# Patient Record
Sex: Female | Born: 1975 | Race: Black or African American | Hispanic: No | Smoking: Never smoker
Health system: Southern US, Community
[De-identification: ages and names within clinical notes are randomized; demographics above are authoritative.]

## PROBLEM LIST (undated history)

## (undated) DIAGNOSIS — I2699 Other pulmonary embolism without acute cor pulmonale: Secondary | ICD-10-CM

## (undated) HISTORY — PX: OTHER SURGICAL HISTORY: SHX169

## (undated) HISTORY — PX: FINGER SURGERY: SHX640

---

## 2001-06-13 ENCOUNTER — Other Ambulatory Visit: Admission: RE | Admit: 2001-06-13 | Discharge: 2001-06-13 | Payer: Self-pay | Admitting: Obstetrics and Gynecology

## 2001-07-11 ENCOUNTER — Encounter: Admission: RE | Admit: 2001-07-11 | Discharge: 2001-10-09 | Payer: Self-pay | Admitting: Obstetrics and Gynecology

## 2001-07-29 ENCOUNTER — Inpatient Hospital Stay (HOSPITAL_COMMUNITY): Admission: AD | Admit: 2001-07-29 | Discharge: 2001-07-29 | Payer: Self-pay | Admitting: Obstetrics and Gynecology

## 2001-07-30 ENCOUNTER — Inpatient Hospital Stay (HOSPITAL_COMMUNITY): Admission: AD | Admit: 2001-07-30 | Discharge: 2001-07-30 | Payer: Self-pay | Admitting: *Deleted

## 2001-08-03 ENCOUNTER — Encounter (INDEPENDENT_AMBULATORY_CARE_PROVIDER_SITE_OTHER): Payer: Self-pay

## 2001-08-03 ENCOUNTER — Inpatient Hospital Stay (HOSPITAL_COMMUNITY): Admission: AD | Admit: 2001-08-03 | Discharge: 2001-08-15 | Payer: Self-pay | Admitting: *Deleted

## 2001-08-04 ENCOUNTER — Encounter: Payer: Self-pay | Admitting: Obstetrics and Gynecology

## 2001-08-07 ENCOUNTER — Encounter: Payer: Self-pay | Admitting: Obstetrics & Gynecology

## 2001-08-11 ENCOUNTER — Encounter: Payer: Self-pay | Admitting: Obstetrics and Gynecology

## 2001-08-12 ENCOUNTER — Encounter: Payer: Self-pay | Admitting: Obstetrics & Gynecology

## 2001-08-16 ENCOUNTER — Encounter: Admission: RE | Admit: 2001-08-16 | Discharge: 2001-09-15 | Payer: Self-pay | Admitting: Obstetrics and Gynecology

## 2001-09-15 ENCOUNTER — Other Ambulatory Visit: Admission: RE | Admit: 2001-09-15 | Discharge: 2001-09-15 | Payer: Self-pay | Admitting: Obstetrics and Gynecology

## 2001-10-14 ENCOUNTER — Encounter: Admission: RE | Admit: 2001-10-14 | Discharge: 2001-11-13 | Payer: Self-pay | Admitting: Obstetrics and Gynecology

## 2001-12-14 ENCOUNTER — Encounter: Admission: RE | Admit: 2001-12-14 | Discharge: 2002-01-13 | Payer: Self-pay | Admitting: Obstetrics and Gynecology

## 2002-02-13 ENCOUNTER — Encounter: Admission: RE | Admit: 2002-02-13 | Discharge: 2002-03-15 | Payer: Self-pay | Admitting: Obstetrics and Gynecology

## 2002-03-16 ENCOUNTER — Encounter: Admission: RE | Admit: 2002-03-16 | Discharge: 2002-04-15 | Payer: Self-pay | Admitting: Obstetrics and Gynecology

## 2002-05-16 ENCOUNTER — Encounter: Admission: RE | Admit: 2002-05-16 | Discharge: 2002-06-15 | Payer: Self-pay | Admitting: Obstetrics and Gynecology

## 2002-07-16 ENCOUNTER — Encounter: Admission: RE | Admit: 2002-07-16 | Discharge: 2002-08-15 | Payer: Self-pay | Admitting: Obstetrics and Gynecology

## 2002-08-16 ENCOUNTER — Encounter: Admission: RE | Admit: 2002-08-16 | Discharge: 2002-09-15 | Payer: Self-pay | Admitting: Obstetrics and Gynecology

## 2002-10-02 ENCOUNTER — Other Ambulatory Visit: Admission: RE | Admit: 2002-10-02 | Discharge: 2002-10-02 | Payer: Self-pay | Admitting: Obstetrics and Gynecology

## 2003-03-06 ENCOUNTER — Encounter: Payer: Self-pay | Admitting: Obstetrics and Gynecology

## 2003-03-06 ENCOUNTER — Encounter: Admission: RE | Admit: 2003-03-06 | Discharge: 2003-03-06 | Payer: Self-pay | Admitting: Obstetrics and Gynecology

## 2004-11-17 ENCOUNTER — Ambulatory Visit (HOSPITAL_COMMUNITY): Admission: RE | Admit: 2004-11-17 | Discharge: 2004-11-17 | Payer: Self-pay | Admitting: *Deleted

## 2004-12-12 ENCOUNTER — Ambulatory Visit (HOSPITAL_BASED_OUTPATIENT_CLINIC_OR_DEPARTMENT_OTHER): Admission: RE | Admit: 2004-12-12 | Discharge: 2004-12-12 | Payer: Self-pay | Admitting: *Deleted

## 2004-12-12 ENCOUNTER — Ambulatory Visit (HOSPITAL_COMMUNITY): Admission: RE | Admit: 2004-12-12 | Discharge: 2004-12-12 | Payer: Self-pay | Admitting: *Deleted

## 2004-12-12 ENCOUNTER — Encounter (INDEPENDENT_AMBULATORY_CARE_PROVIDER_SITE_OTHER): Payer: Self-pay | Admitting: *Deleted

## 2009-03-07 ENCOUNTER — Ambulatory Visit (HOSPITAL_COMMUNITY): Admission: RE | Admit: 2009-03-07 | Discharge: 2009-03-07 | Payer: Self-pay | Admitting: Obstetrics and Gynecology

## 2009-03-07 ENCOUNTER — Encounter (INDEPENDENT_AMBULATORY_CARE_PROVIDER_SITE_OTHER): Payer: Self-pay | Admitting: Obstetrics and Gynecology

## 2010-10-03 LAB — CBC
HCT: 36.2 % (ref 36.0–46.0)
Hemoglobin: 12.3 g/dL (ref 12.0–15.0)
MCHC: 33.9 g/dL (ref 30.0–36.0)
MCV: 94.1 fL (ref 78.0–100.0)
Platelets: 229 10*3/uL (ref 150–400)
RBC: 3.85 MIL/uL — ABNORMAL LOW (ref 3.87–5.11)
RDW: 13.4 % (ref 11.5–15.5)
WBC: 5 10*3/uL (ref 4.0–10.5)

## 2010-11-14 NOTE — Discharge Summary (Signed)
[facility]  Patient:    Helen Vincent, Helen Vincent Visit Number: [id number] MRN: [id number]          Service Type: OBS Location: 910A 9145 01 Attending Physician:  Ermalene Searing Dictated by:   Sheria Lang Cherly Hensen, M.D. Admit Date:  08/03/2001 Discharge Date: 08/15/2001                             Discharge Summary  ADMISSION DIAGNOSES:          1. Severe intrauterine growth restriction.                               2. Intrauterine gestation at 29-4/7 weeks.                               3. Preterm labor.  DISCHARGE DIAGNOSES:          1. Intrauterine growth restriction.                               2. Preterm gestation, delivered, status post                                  cesarean section.  PROCEDURES:                   A primary cesarean section.  HISTORY OF PRESENT ILLNESS:   This is a 35 year old, gravida 4, para 0-0-3-0 female at 29-4/[redacted] weeks gestation, admitted for close fetal surveillance secondary to severe intrauterine growth restriction.  The patient underwent ultrasound on August 03, 2001, which revealed an estimated fetal weight of 2 pounds 3 ounces, which was in the first percentile.  The amniotic fluid index  was normal.  The patients prenatal course has been been complicated by preterm cervical change for which the patient was placed on Procardia after contractions were noted.  She received betamethasone on July 29, 2001, and July 30, 2001.  HOSPITAL COURSE:              The patient was admitted.  She was placed on continuous fetal monitoring.  A nutritional consultation was obtained secondary to poor weight gain.  The patient is a nonsmoker.  She was continued on Procardia 10 mg p.o. q.4h.  Antiphospholipid syndrome laboratory studies were done.  PIH labs were done, which were normal.  The etiology of her intrauterine growth restriction was unclear.  The patient had biophysical profile and Dopplers.  Discussion was placed  regarding the possible use of amniocentesis to determine the role of genetics in this growth restriction. This was declined by the parents.  The patient had biophysical profile of 8/8. She subsequently on August 12, 2001, had absent end-diastolic flow, moderate variable decelerations, though occasional, were noted on the monitor.  Good variability otherwise.  Her case was reviewed with Conni Elliot, M.D., who was the perinatologist, and the decision was made to proceed with delivery.  The patient was taken to the operating room where she underwent a primary cesarean section.  Normal tubes and ovaries were identified.  Live female with a cord around the neck x 1.  Apgars of 6 and 7.  A small placenta was noted.  The  pathology showed 157 g placenta with hypermature villous features.  No intracavitary lesions were noted.  A cord pH was 7.27.  Her CBC on postoperative day #1 showed a hematocrit of 29, a hemoglobin of 10, and a white count of 13.  The patient had an unremarkable postoperative course with the exception of constipation issues.  Her incision showed no erythema, induration, or exudate.  She was deemed well to be discharged home.  DISPOSITION:                  Home.  CONDITION ON DISCHARGE:       Stable.  DISCHARGE MEDICATIONS:        1. Darvocet-N 100 #20 one p.o. q.6-8h. p.r.n.                                  pain.                               2. Motrin 600 mg one p.o. q.6-8h. p.r.n. pain.                               3. Prenatal vitamins one p.o. q.d.                               4. Ferrous sulfate 325 mg one p.o. q.d.                               5. Citrucel one p.o. q.d.                               6. Stool softener twice per day.  FOLLOW-UP INSTRUCTIONS:       Staple removal in the office.  Follow up in four to six weeks postpartum.  DISCHARGE INSTRUCTIONS:       Call for temperature greater than or equal to 100.4 degrees.  Nothing per vagina for four to six  weeks.  No heavy lifting or driving for two weeks.  Call for increased incisional pain, redness, or drainage from the incision site, severe abdominal pain, nausea, or vomiting. Dictated by:   Sheria Lang. Cherly Hensen, M.D. Attending Physician:  Marina Gravel B DD:  09/09/01 TD:  09/11/01 Job: [id number]

## 2010-11-14 NOTE — Op Note (Signed)
[facility]  Patient:    Helen Vincent, Helen Vincent Visit Number: [id number] MRN: [id number]          Service Type: OBS Location: 910A 9145 01 Attending Physician:  Ermalene Searing Dictated by:   Sheria Lang Cherly Hensen, M.D. Proc. Date: 08/12/01 Admit Date:  08/03/2001                             Operative Report  PREOPERATIVE DIAGNOSES:         1. Severe intrauterine growth restriction.                                 2. Abnormal fetal testing.  POSTOPERATIVE DIAGNOSES:        1. Severe intrauterine growth restriction.                                 2. Abnormal fetal testing.  OPERATION:                      Primary cesarean section.  SURGEON:                        Maxie Better, M.D.  ASSISTANT:                      Lenoard Aden, M.D.  ANESTHESIA:                     Spinal.  INDICATIONS:                    This is a 35 year old gravida 4, para 0-0-3-0 female admitted on August 03, 2001, for severe intrauterine growth restriction who on antenatal surveillance was found to have absent end-diastolic flow.  The patient has had betamethasone prior to admission to the hospital and her case was reviewed with the perinatologist.  Decision was made to proceed with a primary cesarean section.  INFORMED CONSENT:               The risks and benefits of the procedure have been explained to the patient and her significant other.  The patient was transferred to the operating room.  DESCRIPTION OF PROCEDURE:     Under adequate spinal anesthesia, the patient was placed in the supine position with a left lateral tilt.  She was st sterilely prepped and draped in the usual fashion.  An indwelling Foley catheter was sterilely placed.  A Pfannenstiel skin incision was then made and 0.25% Marcaine was injected along the incision line.  The incision was carried down to the rectus fascia.  The rectus fascia was incised in the midline and extended bilaterally  and the rectus muscle was then sharply and bluntly dissected off the rectus muscles in the superior and inferior fashion.  The rectus muscles were split in the midline and parietal peritoneum was entered bluntly and extended superiorly, inferiorly and transversely.  The vesicouterine peritoneum was then opened.  The bladder was then dissected off the lower uterine segment and displaced using a bladder retractor.  A curvilinear uterine incision was then made transversely and extended bilaterally using bandage scissors.  Artificial rupture of membranes of clear fluid was noted.  Subsequent delivery of a live female infant from the  right occiput transverse position was accomplished.  A cord around the neck was noted which was reduced.  The cord was then clamped and cut.  The baby was transferred to the awaiting pediatrician who subsequently found Apgars of 6 and 7 at one and five minutes.  The placenta was noted to be small and intact. It was manually removed and sent to pathology.  Cord pH has been obtained which was 7.27.  Uterine cavity was cleaned of debris.  No intracavitary lesions were noted.  The uterine incision was closed in two layers, the first layer of 0 Monocryl locked stitch and the second layer with 0 Monocryl. Normal tubes and ovaries were noted bilaterally.  The abdomen was then irrigated and suctioned, small bleeders cauterized.  The parietal peritoneum and the vesicouterine peritoneum were not closed.  The specimen was sent to pathology.  Estimated blood loss was 700 cc.  Intraoperative fluid was 1800 crystalloid.  Urine output was 200 cc of clear yellow urine.  Sponge and instrument counts x2 were correct.  Complications were none.  The patient tolerated the procedure well and was transferred to the recovery room in stable condition. Dictated by:   Sheria Lang. Cherly Hensen, M.D. Attending Physician:  Marina Gravel B DD:  08/12/01 TD:  08/13/01 Job: [id number]

## 2010-11-14 NOTE — H&P (Signed)
[facility]  Patient:    Helen Vincent, Helen Vincent Visit Number: [id number] MRN: [id number]          Service Type: Attending:  Sheronette A. Cherly Hensen, M.D. Dictated by:   Sheria Lang. Cherly Hensen, M.D. Adm. Date:  08/03/01                           History and Physical  DATE OF BIRTH:                03-05-1976  CHIEF COMPLAINT:              Severe intrauterine growth restriction.  HISTORY OF PRESENT ILLNESS:   This is a 35 year old gravida 4 para 0, 0-3-0, female who presented for late prenatal care, and whose EDC is October 15, 2001 by ultrasound, who is now at 29+ weeks gestation with severe intrauterine growth restriction, admitted for close antepartum fetal surveillance.  The patient had an ultrasound on August 03, 2001 which revealed an estimated fetal weight of 2 pounds 3 ounces, which was at the first percentile, normal amniotic fluid index.  The patients prenatal course to date has been complicated by preterm cervical change, initially noted on July 29, 2001. The patient was subsequently sent to [facility] for a nonstress test.  She received betamethasone on July 29, 2001 and July 30, 2001.  On todays visit she underwent a fetal fibronectin as well as a group B strep culture.  Cervical length was checked and was 3.2 cm. Clinically her examination is fingertip internal os, developing lower uterine segment, cervix about 2-2.5 cm long with vertex that is ballottable.  The patient was treated with bacterial vaginosis July 07, 2001.  She had nutritional consultation secondary to poor weight gain.  Her total weight gain to date is 18 pounds.  The patient is a nonsmoker.  She has no history of hypertension.  The patient has been on Procardia 10 mg p.o. q.4h after she presented for her second betamethasone shot and was noted to have contractions on the monitor.  Prenatal care is at [facility], primary  obstetrician Sheronette A. Cherly Hensen, M.D.  Blood type is A-positive.  Antibody screen negative.  Hemoglobin electrophoresis normal.  RPR nonreactive.  Rubella immune.   Hepatitis B surface antigen negative.  HIV test nonreactive.  GC and Chlamydia cultures negative.  The patient presented too late for AFP 3 testing.  Ultrasound on June 06, 2001 showed 21-1/7th weeks gestation with an [facility] of October 15, 2001.  The patient had anatomic survey done at [facility], which showed a normal anatomic fetal survey and 22 weeks by ultrasound done on June 16, 2001, with an [facility] of October 20, 2001.  Cervical length at that time was 3.9 cm.  One hour GCT was done, result is pending.  ALLERGIES:                    CODEINE.  MEDICATIONS:                  Prenatal vitamins.  PAST MEDICAL HISTORY:         1. Chronic constipation.                               2. Question irritable bowel syndrome.  3. Asthma in childhood.  PAST SURGICAL HISTORY:        1. Finger surgery.                               2. Hernia at age [age].                               3. D&C x3.  PAST OBSTETRICAL HISTORY:     1. First trimester TAB x2, in 1996 and 2000.                               2. Blighted ovum in 2001.  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  REVIEW OF SYSTEMS:            Negative.  PHYSICAL EXAMINATION:  GENERAL:                      Well-developed, well-nourished gravid black female, in no acute distress.  VITAL SIGNS:                  Blood pressure 118/76.  Weight 154.6 pounds. Fetal heart rate 148.  SKIN:                         No lesions.  HEENT:                        Sclerae anicteric.  Conjunctivae pink. Oropharynx negative.  HEART:                        Regular rate and rhythm without murmur.  LUNGS:                        Clear to auscultation.  BREAST:                        Soft, nontender.  No palpable mass.  ABDOMEN:                      Gravid, fundal height 28 cm.  PELVIC:                       Fingertip internal os, soft, 2.5 cm long, vertex high.  EXTREMITIES:                  No edema.  IMPRESSION:                   1. Severe intrauterine growth restriction,                                  intrauterine gestation at 29-4/7th weeks.                               2. Preterm cervical change/preterm labor.  PLAN:  1. Admission.                               2. Continue fetal monitoring.                               3. PIH laboratories.                               4. Antiphospholipid antibody syndrome                                  evaluation.                               5. Check fetal fibronectin and group B strep                                  culture.                               6. Continue Procardia 10 mg p.o. q.4h.                               7. Repeat growth at two weeks post last                                  sonogram.                               8. Nutritional consultation. Dictated by:   Sheria Lang. Cherly Hensen, M.D. Attending:  Sheronette A. Cherly Hensen, M.D. DD:  08/04/01 TD:  08/04/01 Job: [id number]

## 2010-11-14 NOTE — Op Note (Signed)
NAMETRINH, SANJOSE               ACCOUNT NO.:  [id number]   MEDICAL RECORD NO.:  [id number]          PATIENT TYPE:  AMB   LOCATION:  NESC                         FACILITY[facility]  [facility]   PHYSICIAN:  Vikki Ports, MDDATE OF BIRTH:  [birth year]   DATE OF PROCEDURE:  12/12/2004  DATE OF DISCHARGE:                                 OPERATIVE REPORT   PREOPERATIVE DIAGNOSIS:  Subcutaneous mass of the posterior neck.   POSTOPERATIVE DIAGNOSIS:  Subcutaneous mass of the posterior neck, lipoma.   PROCEDURE:  Excision of a posterior neck mass.   SURGEON:  Vikki Ports, M.D.   ANESTHESIA:  Local with MAC.   DESCRIPTION:  The patient was taken to the operating room, placed in the  left lateral decubitus position.  After adequate MAC anesthesia was induced,  the posterior neck was prepped and draped in a normal sterile fashion.  Using 1% lidocaine local anesthesia, the skin was anesthetized overlying the  mass.  A transverse incision was made.  The well-encapsulated lipoma was  delivered up and through the wound and excised in its entirety.  Adequate  hemostasis was insured, and the skin was closed with subcuticular 3-0  Monocryl.  A Dermabond dressing was placed.  The patient tolerated the  procedure well and went to the PACU in good condition.       KRH/MEDQ  D:  12/12/2004  T:  12/12/2004  Job:  [id number]

## 2010-12-22 ENCOUNTER — Inpatient Hospital Stay (INDEPENDENT_AMBULATORY_CARE_PROVIDER_SITE_OTHER)
Admission: RE | Admit: 2010-12-22 | Discharge: 2010-12-22 | Disposition: A | Discharge status: Home / Self Care | Payer: 59 | Source: Ambulatory Visit | Attending: Family Medicine | Admitting: Family Medicine

## 2010-12-22 DIAGNOSIS — R079 Chest pain, unspecified: Secondary | ICD-10-CM

## 2011-07-23 ENCOUNTER — Encounter (HOSPITAL_COMMUNITY): Payer: Self-pay | Admitting: Emergency Medicine

## 2011-07-23 ENCOUNTER — Emergency Department (HOSPITAL_COMMUNITY)
Admission: EM | Admit: 2011-07-23 | Discharge: 2011-07-23 | Disposition: A | Discharge status: Home / Self Care | Payer: 59 | Source: Home / Self Care | Attending: Emergency Medicine | Admitting: Emergency Medicine

## 2011-07-23 DIAGNOSIS — S139XXA Sprain of joints and ligaments of unspecified parts of neck, initial encounter: Secondary | ICD-10-CM

## 2011-07-23 DIAGNOSIS — S161XXA Strain of muscle, fascia and tendon at neck level, initial encounter: Secondary | ICD-10-CM

## 2011-07-23 HISTORY — DX: Other pulmonary embolism without acute cor pulmonale: I26.99

## 2011-07-23 LAB — PROTIME-INR: INR: 4.63 — ABNORMAL HIGH (ref 0.00–1.49)

## 2011-07-23 MED ORDER — MELOXICAM 15 MG PO TABS: 15.0000 mg | ORAL_TABLET | Freq: Every day | ORAL | Status: DC

## 2011-07-23 MED ORDER — DIAZEPAM 5 MG PO TABS: 5.0000 mg | ORAL_TABLET | Freq: Four times a day (QID) | ORAL | Status: AC | PRN

## 2011-07-23 NOTE — ED Provider Notes (Signed)
History     CSN: [id number]  Arrival date & time 07/23/11  1353   First MD Initiated Contact with Patient 07/23/11 1358      Chief Complaint  Patient presents with  . Generalized Body Aches    (Consider location/radiation/quality/duration/timing/severity/associated sxs/prior treatment) HPI Comments: Patient with 2 weeks of intermittent right upper neck and back achiness and "soreness". States the soreness radiates up the back of her head. No other headaches. Pain is worse with neck flexion, irritation, lifting heavy objects, and with use. Pain better with warm compresses and massage. Has also tried Tylenol and Motrin without improvement. Patient notes no change in physical activity, however, she does spend a lot of time with her neck bent forward while sitting. No nausea, vomiting, fevers, chest pain, wheezing, shortness of breath. No numbness, weakness, rash. No remote or recent history of trauma to the cervical or thoracic spine. Patient also with a history of spontaneous PE and was being managed by pulmonologist and cardiologist at [facility] for this. States that she discontinued the warfarin on her own several months ago due to hair loss, however, started herself on 10 mg of warfarin once daily proximally 2-3 months ago, because she was feeling short of breath. Patient has not had her PT/INR checked since restarting the warfarin. Patient denies chest pain or shortness of breath today. Denies epistaxis, hemoptysis, GI bleeding, easy bruising.  ROS as noted in HPI. All other ROS negative.   The history is provided by the patient.    Past Medical History  Diagnosis Date  . Pulmonary embolism     Past Surgical History  Procedure Date  . Finger surgery   . Lypoma   . Cesarean section     No family history on file.  History  Substance Use Topics  . Smoking status: Never Smoker   . Smokeless tobacco: Not on file  . Alcohol Use: No    OB History    Grav Para Term Preterm  Abortions TAB SAB Ect Mult Living                  Review of Systems  Allergies  Codeine and Shellfish allergy  Home Medications   Current Outpatient Rx  Name Route Sig Dispense Refill  . WARFARIN SODIUM PO Oral Take by mouth.    Marland Kitchen DIAZEPAM 5 MG PO TABS Oral Take 1 tablet (5 mg total) by mouth every 6 (six) hours as needed for anxiety. 16 tablet 0    BP 106/73  Pulse 74  Temp(Src) 99.2 F (37.3 C) (Oral)  Resp 18  SpO2 100%  LMP 07/01/2011  Physical Exam  Nursing note and vitals reviewed. Constitutional: She is oriented to person, place, and time. She appears well-developed and well-nourished. No distress.  HENT:  Head: Normocephalic and atraumatic.  Eyes: Conjunctivae and EOM are normal.  Neck: Normal range of motion and full passive range of motion without pain. Neck supple. Muscular tenderness present. No spinous process tenderness present.         Tenderness, muscle spasm R>L. Pain aggravated with lateral bending of head, rotation of head right.   Cardiovascular: Regular rhythm.   Pulmonary/Chest: Effort normal and breath sounds normal.  Abdominal: Soft. She exhibits no distension.  Musculoskeletal: Normal range of motion.       Back:       Shoulder exam  WNL bilaterally. Strength 5/5 bilaterally.  Sensation grossly intact. RP 2+ bilaterally.   Neurological: She is alert and oriented to person,  place, and time. She has normal strength. No cranial nerve deficit or sensory deficit. Gait normal.  Skin: Skin is warm and dry.  Psychiatric: She has a normal mood and affect. Her behavior is normal. Judgment and thought content normal.    ED Course  Procedures (including critical care time)   Labs Reviewed  PROTIME-INR   No results found.   1. Cervical strain     Results for orders placed during the hospital encounter of 07/23/11  PROTIME-INR      Component Value Range   Prothrombin Time 44.4 (*) 11.6 - 15.2 (seconds)   INR 4.63 (*) 0.00 - 1.49       MDM  H&P most consistent with muscle strain/spasm right trapezius and upper back. Has been spending more time with her head tilted forward, reading recently. Patient also states that she's not had her INR checked since restarting herself on Coumadin 10 mg once daily 3 months ago. She has a cardiologist at [facility]. Will check PT/ INR today, and have patient call back for results. Will change warfarin dose if needed at that time. Patient will take her results to her cardiologist with whom she has an appointment next Tuesday. Discussed when patient needs return to the ED. Patient agrees with plan.  Labs noted. Nurse called patient with results. Will have patient hold next dose of warfarin. Patient to have her INR checked within 24 hours of discontinuing warfarin, and followup with her cardiologist within one day after repeat INR and to have her warfarin restarted at an appropriate dose.  Luiz Blare, MD 07/23/11 2145

## 2011-07-23 NOTE — ED Notes (Signed)
C/o headache , shoulders, neck pain.  Onset 2 weeks ago.  Also c/o dizziness, weakness, fatigue, intermittent blurry vision

## 2011-07-23 NOTE — Discharge Instructions (Signed)
It will take 2 hours for the labs to come back. You can call before 6 pm today for your results, or after 1130 tomorrow.. You will need to come here in person to get a paper copy for you to take to your cardiologist. You may take the valium as needed for myuscle spasm. Continue tylenol 1 gram up to 4 times a day. Do not do this for more than 2 weeks as it can increase your INR and risk of bleeding. Continue warm compresses and massage. Return for fevers >100.4, serious nosebleeds, rectal bleeding, vomiting blood, or any concerns.

## 2011-07-23 NOTE — ED Notes (Signed)
Notified patient of labs, instructed to skip next warfarin dose and have blood drawn 24 hours after stopping warfarin dosing.  Patient agreeable.  Instructions were developed by dr Chaney Malling.  Patient was located at [phone].  [email or web address], rn

## 2011-07-23 NOTE — ED Notes (Signed)
Spoke to dr [employer] abouot labs

## 2011-07-23 NOTE — ED Notes (Signed)
Verified phone number [phone]

## 2011-07-24 NOTE — ED Notes (Addendum)
Pt. called back and she wanted Dr. Chaney Malling to know that she went to the Coumadin clinic today.  She said her INR was 4.9. She was told to stop the coumadin for the rest of the weekend and go back to them on Mon. for another PT-INR. They will continue to follow her.  Dr. Chaney Malling notified. Vassie Moselle 07/24/2011

## 2011-12-04 ENCOUNTER — Ambulatory Visit: Payer: 59 | Admitting: Physical Therapy

## 2011-12-10 ENCOUNTER — Encounter: Payer: 59 | Admitting: Physical Therapy

## 2011-12-10 ENCOUNTER — Ambulatory Visit: Payer: 59 | Attending: Neurology | Admitting: Physical Therapy

## 2011-12-10 DIAGNOSIS — R51 Headache: Secondary | ICD-10-CM | POA: Insufficient documentation

## 2011-12-10 DIAGNOSIS — IMO0001 Reserved for inherently not codable concepts without codable children: Secondary | ICD-10-CM | POA: Insufficient documentation

## 2011-12-10 DIAGNOSIS — R42 Dizziness and giddiness: Secondary | ICD-10-CM | POA: Insufficient documentation

## 2011-12-11 ENCOUNTER — Other Ambulatory Visit: Payer: Self-pay | Admitting: Obstetrics and Gynecology

## 2011-12-11 DIAGNOSIS — N644 Mastodynia: Secondary | ICD-10-CM

## 2011-12-16 ENCOUNTER — Ambulatory Visit: Payer: 59 | Admitting: Physical Therapy

## 2011-12-23 ENCOUNTER — Ambulatory Visit: Payer: 59 | Admitting: Physical Therapy

## 2011-12-24 ENCOUNTER — Ambulatory Visit
Admission: RE | Admit: 2011-12-24 | Discharge: 2011-12-24 | Disposition: A | Discharge status: Home / Self Care | Payer: 59 | Source: Ambulatory Visit | Attending: Obstetrics and Gynecology | Admitting: Obstetrics and Gynecology

## 2011-12-24 DIAGNOSIS — N644 Mastodynia: Secondary | ICD-10-CM

## 2011-12-29 ENCOUNTER — Encounter: Payer: 59 | Admitting: Physical Therapy

## 2012-01-06 ENCOUNTER — Encounter: Payer: 59 | Admitting: Physical Therapy

## 2012-01-07 ENCOUNTER — Ambulatory Visit: Payer: 59 | Attending: Neurology | Admitting: Physical Therapy

## 2012-01-07 DIAGNOSIS — IMO0001 Reserved for inherently not codable concepts without codable children: Secondary | ICD-10-CM | POA: Insufficient documentation

## 2012-01-07 DIAGNOSIS — R42 Dizziness and giddiness: Secondary | ICD-10-CM | POA: Insufficient documentation

## 2012-01-07 DIAGNOSIS — R51 Headache: Secondary | ICD-10-CM | POA: Insufficient documentation

## 2012-01-14 ENCOUNTER — Encounter: Payer: 59 | Admitting: Physical Therapy

## 2012-01-15 ENCOUNTER — Encounter: Payer: 59 | Admitting: Physical Therapy

## 2012-01-20 ENCOUNTER — Encounter: Payer: 59 | Admitting: Physical Therapy

## 2012-01-27 ENCOUNTER — Encounter: Payer: 59 | Admitting: Physical Therapy

## 2012-02-03 ENCOUNTER — Encounter: Payer: 59 | Admitting: Physical Therapy

## 2012-02-03 ENCOUNTER — Encounter: Payer: 59 | Admitting: *Deleted

## 2014-03-28 ENCOUNTER — Other Ambulatory Visit: Payer: Self-pay

## 2014-03-28 DIAGNOSIS — Z1231 Encounter for screening mammogram for malignant neoplasm of breast: Secondary | ICD-10-CM

## 2014-05-23 ENCOUNTER — Ambulatory Visit
Admission: RE | Admit: 2014-05-23 | Discharge: 2014-05-23 | Disposition: A | Discharge status: Home / Self Care | Payer: 59 | Source: Ambulatory Visit

## 2014-05-23 DIAGNOSIS — Z1231 Encounter for screening mammogram for malignant neoplasm of breast: Secondary | ICD-10-CM

## 2015-06-10 ENCOUNTER — Other Ambulatory Visit: Payer: Self-pay | Admitting: Family Medicine

## 2015-06-10 DIAGNOSIS — Z1231 Encounter for screening mammogram for malignant neoplasm of breast: Secondary | ICD-10-CM

## 2015-06-20 ENCOUNTER — Ambulatory Visit: Payer: Self-pay

## 2015-09-26 ENCOUNTER — Emergency Department (HOSPITAL_BASED_OUTPATIENT_CLINIC_OR_DEPARTMENT_OTHER): Payer: BLUE CROSS/BLUE SHIELD

## 2015-09-26 ENCOUNTER — Encounter (HOSPITAL_BASED_OUTPATIENT_CLINIC_OR_DEPARTMENT_OTHER): Payer: Self-pay | Admitting: Emergency Medicine

## 2015-09-26 ENCOUNTER — Emergency Department (HOSPITAL_BASED_OUTPATIENT_CLINIC_OR_DEPARTMENT_OTHER)
Admission: EM | Admit: 2015-09-26 | Discharge: 2015-09-27 | Disposition: A | Discharge status: Home / Self Care | Payer: BLUE CROSS/BLUE SHIELD | Attending: Emergency Medicine | Admitting: Emergency Medicine

## 2015-09-26 DIAGNOSIS — R202 Paresthesia of skin: Secondary | ICD-10-CM | POA: Diagnosis not present

## 2015-09-26 DIAGNOSIS — R51 Headache: Secondary | ICD-10-CM | POA: Insufficient documentation

## 2015-09-26 DIAGNOSIS — R200 Anesthesia of skin: Secondary | ICD-10-CM | POA: Diagnosis present

## 2015-09-26 DIAGNOSIS — Z7901 Long term (current) use of anticoagulants: Secondary | ICD-10-CM | POA: Insufficient documentation

## 2015-09-26 DIAGNOSIS — Z86711 Personal history of pulmonary embolism: Secondary | ICD-10-CM | POA: Diagnosis not present

## 2015-09-26 DIAGNOSIS — R0789 Other chest pain: Secondary | ICD-10-CM | POA: Diagnosis not present

## 2015-09-26 LAB — CBC
HCT: 30.6 % — ABNORMAL LOW (ref 36.0–46.0)
Hemoglobin: 9.7 g/dL — ABNORMAL LOW (ref 12.0–15.0)
MCH: 26.1 pg (ref 26.0–34.0)
MCHC: 31.7 g/dL (ref 30.0–36.0)
MCV: 82.3 fL (ref 78.0–100.0)
PLATELETS: 293 10*3/uL (ref 150–400)
RBC: 3.72 MIL/uL — AB (ref 3.87–5.11)
RDW: 16.1 % — AB (ref 11.5–15.5)
WBC: 6.1 10*3/uL (ref 4.0–10.5)

## 2015-09-26 LAB — COMPREHENSIVE METABOLIC PANEL
ALBUMIN: 4 g/dL (ref 3.5–5.0)
ALT: 10 U/L — AB (ref 14–54)
AST: 19 U/L (ref 15–41)
Alkaline Phosphatase: 53 U/L (ref 38–126)
Anion gap: 8 (ref 5–15)
BUN: 10 mg/dL (ref 6–20)
CHLORIDE: 106 mmol/L (ref 101–111)
CO2: 22 mmol/L (ref 22–32)
CREATININE: 0.98 mg/dL (ref 0.44–1.00)
Calcium: 9 mg/dL (ref 8.9–10.3)
GFR calc Af Amer: >60 mL/min (ref >60)
GFR calc non Af Amer: >60 mL/min (ref >60)
GLUCOSE: 113 mg/dL — AB (ref 65–99)
POTASSIUM: 3.8 mmol/L (ref 3.5–5.1)
Sodium: 136 mmol/L (ref 135–145)
Total Bilirubin: 0.8 mg/dL (ref 0.3–1.2)
Total Protein: 7.3 g/dL (ref 6.5–8.1)

## 2015-09-26 LAB — TROPONIN I: Troponin I: <0.03 ng/mL (ref <0.031)

## 2015-09-26 NOTE — ED Notes (Signed)
MD at bedside discussing test results and updated plan of care.

## 2015-09-26 NOTE — ED Notes (Signed)
Patient states that about an hour ago, she started to have pain to her left chest and numbness up to her left face and lip. Patient states that she continues to have pain to her left chest and left face. Patient reports that she is also have numbness to her left face, lips and arm

## 2015-09-26 NOTE — ED Provider Notes (Signed)
11:45 PM  Assumed care from Dr. Denton LankSteinl.  Pt is a 40 y.o. 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 Plan was to repeat second troponin as her workup in the emergency room has been unremarkable. Plan was to discharge patient home if troponin was negative and symptoms don't resolve. When I reevaluate patient, she reports she is still having left-sided facial numbness and left arm numbness. No obvious weakness on exam but she does report decreased sensation when I palpate her left face and arm. Her head CT is normal. The symptoms began at 7:30 PM. Her NIH stroke scale would be a 1 and therefore she is not a TPA candidate given her low stroke scale. She is very concerned and I feel is reasonable to obtain an MRI of her brain to rule out stroke. [facility] for an MRI of her brain. Have discussed with Dr. Sherlie BanScott Goldstein in the emergency department who has accepted the patient. She is not having any chest pain currently. Her labs today have been unremarkable other than mild anemia. Chest x-ray clear. Head CT shows no intracranial hemorrhage. [facility] We will leave her peripheral IV in her L AC.    Helen MawKristen N Beverlee Wilmarth, DO 09/26/15 2356

## 2015-09-26 NOTE — ED Notes (Signed)
Patient transported to X-ray 

## 2015-09-26 NOTE — Discharge Instructions (Signed)
It was our pleasure to provide your ER care today - we hope that you feel better.  Take your medication as prescribed - do not skip or miss doses.  Follow up with your primary care doctor in the next 1-2 days for recheck.  For chest pain, follow up with cardiologist in the coming week - see referral - call office to arrange appointment.  Return to ER right away if worse, new symptoms, change in speech or vision, one-sided loss of sensation or weakness, persistent or recurrent chest pain, trouble breathing, other concern.     Nonspecific Chest Pain  Chest pain can be caused by many different conditions. There is always a chance that your pain could be related to something serious, such as a heart attack or a blood clot in your lungs. Chest pain can also be caused by conditions that are not life-threatening. If you have chest pain, it is very important to follow up with your health care provider. CAUSES  Chest pain can be caused by:  Heartburn.  Pneumonia or bronchitis.  Anxiety or stress.  Inflammation around your heart (pericarditis) or lung (pleuritis or pleurisy).  A blood clot in your lung.  A collapsed lung (pneumothorax). It can develop suddenly on its own (spontaneous pneumothorax) or from trauma to the chest.  Shingles infection (varicella-zoster virus).  Heart attack.  Damage to the bones, muscles, and cartilage that make up your chest wall. This can include:  Bruised bones due to injury.  Strained muscles or cartilage due to frequent or repeated coughing or overwork.  Fracture to one or more ribs.  Sore cartilage due to inflammation (costochondritis). RISK FACTORS  Risk factors for chest pain may include:  Activities that increase your risk for trauma or injury to your chest.  Respiratory infections or conditions that cause frequent coughing.  Medical conditions or overeating that can cause heartburn.  Heart disease or family history of heart  disease.  Conditions or health behaviors that increase your risk of developing a blood clot.  Having had chicken pox (varicella zoster). SIGNS AND SYMPTOMS Chest pain can feel like:  Burning or tingling on the surface of your chest or deep in your chest.  Crushing, pressure, aching, or squeezing pain.  Dull or sharp pain that is worse when you move, cough, or take a deep breath.  Pain that is also felt in your back, neck, shoulder, or arm, or pain that spreads to any of these areas. Your chest pain may come and go, or it may stay constant. DIAGNOSIS Lab tests or other studies may be needed to find the cause of your pain. Your health care provider may have you take a test called an ambulatory ECG (electrocardiogram). An ECG records your heartbeat patterns at the time the test is performed. You may also have other tests, such as:  Transthoracic echocardiogram (TTE). During echocardiography, sound waves are used to create a picture of all of the heart structures and to look at how blood flows through your heart.  Transesophageal echocardiogram (TEE).This is a more advanced imaging test that obtains images from inside your body. It allows your health care provider to see your heart in finer detail.  Cardiac monitoring. This allows your health care provider to monitor your heart rate and rhythm in real time.  Holter monitor. This is a portable device that records your heartbeat and can help to diagnose abnormal heartbeats. It allows your health care provider to track your heart activity for several days, if  needed.  Stress tests. These can be done through exercise or by taking medicine that makes your heart beat more quickly.  Blood tests.  Imaging tests. TREATMENT  Your treatment depends on what is causing your chest pain. Treatment may include:  Medicines. These may include:  Acid blockers for heartburn.  Anti-inflammatory medicine.  Pain medicine for inflammatory  conditions.  Antibiotic medicine, if an infection is present.  Medicines to dissolve blood clots.  Medicines to treat coronary artery disease.  Supportive care for conditions that do not require medicines. This may include:  Resting.  Applying heat or cold packs to injured areas.  Limiting activities until pain decreases. HOME CARE INSTRUCTIONS  If you were prescribed an antibiotic medicine, finish it all even if you start to feel better.  Avoid any activities that bring on chest pain.  Do not use any tobacco products, including cigarettes, chewing tobacco, or electronic cigarettes. If you need help quitting, ask your health care provider.  Do not drink alcohol.  Take medicines only as directed by your health care provider.  Keep all follow-up visits as directed by your health care provider. This is important. This includes any further testing if your chest pain does not go away.  If heartburn is the cause for your chest pain, you may be told to keep your head raised (elevated) while sleeping. This reduces the chance that acid will go from your stomach into your esophagus.  Make lifestyle changes as directed by your health care provider. These may include:  Getting regular exercise. Ask your health care provider to suggest some activities that are safe for you.  Eating a heart-healthy diet. A registered dietitian can help you to learn healthy eating options.  Maintaining a healthy weight.  Managing diabetes, if necessary.  Reducing stress. SEEK MEDICAL CARE IF:  Your chest pain does not go away after treatment.  You have a rash with blisters on your chest.  You have a fever. SEEK IMMEDIATE MEDICAL CARE IF:   Your chest pain is worse.  You have an increasing cough, or you cough up blood.  You have severe abdominal pain.  You have severe weakness.  You faint.  You have chills.  You have sudden, unexplained chest discomfort.  You have sudden, unexplained  discomfort in your arms, back, neck, or jaw.  You have shortness of breath at any time.  You suddenly start to sweat, or your skin gets clammy.  You feel nauseous or you vomit.  You suddenly feel light-headed or dizzy.  Your heart begins to beat quickly, or it feels like it is skipping beats. These symptoms may represent a serious problem that is an emergency. Do not wait to see if the symptoms will go away. Get medical help right away. Call your local emergency services (911 in the [place] Do not drive yourself to the hospital.   This information is not intended to replace advice given to you by your health care provider. Make sure you discuss any questions you have with your health care provider.   Document Released: 03/25/2005 Document Revised: 07/06/2014 Document Reviewed: 01/19/2014 Elsevier Interactive Patient Education 2016 Elsevier Inc.    Paresthesia Paresthesia is an abnormal burning or prickling sensation. This sensation is generally felt in the hands, arms, legs, or feet. However, it may occur in any part of the body. Usually, it is not painful. The feeling may be described as:  Tingling or numbness.  Pins and needles.  Skin crawling.  Buzzing.  Limbs  falling asleep.  Itching. Most people experience temporary (transient) paresthesia at some time in their lives. Paresthesia may occur when you breathe too quickly (hyperventilation). It can also occur without any apparent cause. Commonly, paresthesia occurs when pressure is placed on a nerve. The sensation quickly goes away after the pressure is removed. For some people, however, paresthesia is a long-lasting (chronic) condition that is caused by an underlying disorder. If you continue to have paresthesia, you may need further medical evaluation. HOME CARE INSTRUCTIONS Watch your condition for any changes. Taking the following actions may help to lessen any discomfort that you are feeling:  Avoid drinking  alcohol.  Try acupuncture or massage to help relieve your symptoms.  Keep all follow-up visits as directed by your health care provider. This is important. SEEK MEDICAL CARE IF:  You continue to have episodes of paresthesia.  Your burning or prickling feeling gets worse when you walk.  You have pain, cramps, or dizziness.  You develop a rash. SEEK IMMEDIATE MEDICAL CARE IF:  You feel weak.  You have trouble walking or moving.  You have problems with speech, understanding, or vision.  You feel confused.  You cannot control your bladder or bowel movements.  You have numbness after an injury.  You faint.   This information is not intended to replace advice given to you by your health care provider. Make sure you discuss any questions you have with your health care provider.   Document Released: 06/05/2002 Document Revised: 10/30/2014 Document Reviewed: 06/11/2014 Elsevier Interactive Patient Education [employer] Elsevier Inc.

## 2015-09-26 NOTE — ED Provider Notes (Addendum)
CSN: [id number]     Arrival date & time 09/26/15  2014 History  By signing my name below, I, Gonzella LexKimberly Bianca Gray, attest that this documentation has been prepared under the direction and in the presence of Cathren LaineKevin Hailley Byers, MD. Electronically Signed: Gonzella LexKimberly Bianca Gray, Scribe. 09/26/2015. 8:47 PM.  Chief Complaint  Patient presents with  . Numbness   The history is provided by the patient. No language interpreter was used.   HPI Comments: Helen Vincent is a 40 y.o. female, with a PMHx of previous chest pain and bilateral pulmonary embolism in 2009 and 2012, ?associated w ocp use,  who presents to the Emergency Department complaining of sudden onset of constant, moderate, left-sided chest pain (preceded by a HA), with radiation into the left side of her face and down her LUE and LLE, which began about one hour ago while she was at a client's house. Patient indicates at time, client was emotional about possibly having child taken out of the home.  Pt notes associated light-headedness, as well as left-sided facial pain, left-sided facial numbness, and left-sided numbness in her lip, which have just begun to mildly improve while in the ED. She also reports associated trembling and notes tingling in her LLE. After leaving the client's house, pt stopped at [facility] and took 3 aspirin. She also reports that after her first bout of chest pain, she ate a piece of cake and has been feeling "burpy" and gassy since then. 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 Pt is currently on Xarelto but does not take it as regularly as she is supposed to, but states has adequate supply. She also reports that she was scheduled for an upcoming consultation for her recent SOB and coughing. Pt denies leg swelling, change in speech, hx of smoking, and issues with her balance or coordination  during her episode of chest pain. No recent immobility, trauma, travel, or surgery. Pt has a FHx of heart disease in her grandmother. No fam hx premature cad. No radiation of pain to abd, back or flank.   Past Medical History  Diagnosis Date  . Pulmonary embolism [facility](HCC)    Past Surgical History  Procedure Laterality Date  . Finger surgery    . Lypoma    . Cesarean section     History reviewed. No pertinent family history. Social History  Substance Use Topics  . Smoking status: Never Smoker   . Smokeless tobacco: None  . Alcohol Use: No   OB History    No data available     Review of Systems  Constitutional: Negative for fever and chills.  HENT: Negative for sore throat.        Facial pain and numbness  Respiratory: Positive for cough and shortness of breath.   Cardiovascular: Positive for chest pain. Negative for leg swelling.  Gastrointestinal: Negative for vomiting and diarrhea.       "feels gassy"  Genitourinary: Negative for dysuria.  Skin: Negative for rash.  Neurological: Positive for tremors, light-headedness, numbness and headaches. Negative for syncope and speech difficulty.  Psychiatric/Behavioral: Negative for confusion.   Allergies  Codeine and Shellfish allergy  Home Medications   Prior to Admission medications   Medication Sig Start Date End Date Taking? Authorizing Provider  rivaroxaban (XARELTO) 20 MG TABS tablet Take 20 mg by mouth daily with  supper.   Yes Historical Provider, MD  WARFARIN SODIUM PO Take by mouth.    Historical Provider, MD   There were no vitals taken for this visit. Physical Exam  Constitutional: She is oriented to person, place, and time. She appears well-developed and well-nourished. No distress.  HENT:  Head: Normocephalic and atraumatic.  Nose: Nose normal.  Mouth/Throat: Oropharynx is clear and moist.  No sinus or temporal tenderness.  Eyes: Conjunctivae and EOM are normal. Pupils are equal, round, and reactive to light. No  scleral icterus.  Neck: Neck supple. No tracheal deviation present. No thyromegaly present.  No stiffness or rigidity. No bruits.   Cardiovascular: Normal rate, regular rhythm, normal heart sounds and intact distal pulses.  Exam reveals no gallop and no friction rub.   No murmur heard. Equal, 2+ pulses bil.  Pulmonary/Chest: Effort normal and breath sounds normal. No respiratory distress. She has no wheezes.  Abdominal: Soft. Normal appearance and bowel sounds are normal. She exhibits no distension. There is no tenderness.  Genitourinary:  No cva tenderness.  Musculoskeletal: Normal range of motion. She exhibits no edema or tenderness.  Neurological: She is alert and oriented to person, place, and time. No cranial nerve deficit.  No pronator drift. Motor intact bilaterally. stre 5/5. sens intact. Steady gait.   Skin: Skin is warm and dry. No rash noted.  Psychiatric: She has a normal mood and affect.  Nursing note and vitals reviewed.   ED Course  Procedures  DIAGNOSTIC STUDIES:    Oxygen Saturation is 100% on RA, normal by my interpretation.   COORDINATION OF CARE:  8:49 PM Will order DG chest, CT head, EKG, and lab work. Discussed treatment plan with pt at bedside and pt agreed to plan.   Labs Review   Results for orders placed or performed during the hospital encounter of 09/26/15  CBC  Result Value Ref Range   WBC 6.1 4.0 - 10.5 K/uL   RBC 3.72 (L) 3.87 - 5.11 MIL/uL   Hemoglobin 9.7 (L) 12.0 - 15.0 g/dL   HCT 16.1 (L) 09.6 - 04.5 %   MCV 82.3 78.0 - 100.0 fL   MCH 26.1 26.0 - 34.0 pg   MCHC 31.7 30.0 - 36.0 g/dL   RDW 40.9 (H) 81.1 - 91.4 %   Platelets 293 150 - 400 K/uL  Comprehensive metabolic panel  Result Value Ref Range   Sodium 136 135 - 145 mmol/L   Potassium 3.8 3.5 - 5.1 mmol/L   Chloride 106 101 - 111 mmol/L   CO2 22 22 - 32 mmol/L   Glucose, Bld 113 (H) 65 - 99 mg/dL   BUN 10 6 - 20 mg/dL   Creatinine, Ser 7.82 0.44 - 1.00 mg/dL   Calcium 9.0 8.9 -  95.6 mg/dL   Total Protein 7.3 6.5 - 8.1 g/dL   Albumin 4.0 3.5 - 5.0 g/dL   AST 19 15 - 41 U/L   ALT 10 (L) 14 - 54 U/L   Alkaline Phosphatase 53 38 - 126 U/L   Total Bilirubin 0.8 0.3 - 1.2 mg/dL   GFR calc non Af Amer >60 >60 mL/min   GFR calc Af Amer >60 >60 mL/min   Anion gap 8 5 - 15  Troponin I  Result Value Ref Range   Troponin I <0.03 <0.031 ng/mL   Dg Chest 2 View  09/26/2015  CLINICAL DATA:  Chest pain EXAM: CHEST  2 VIEW COMPARISON:  06/19/2015 chest radiograph. FINDINGS: Stable cardiomediastinal silhouette  with normal heart size. No pneumothorax. No pleural effusion. Lungs appear clear, with no acute consolidative airspace disease and no pulmonary edema. IMPRESSION: No active cardiopulmonary disease. Electronically Signed   By: Delbert Phenix M.D.   On: 09/26/2015 21:06   Ct Head Wo Contrast  09/26/2015  CLINICAL DATA:  40 year old female with headache and left facial numbness EXAM: CT HEAD WITHOUT CONTRAST TECHNIQUE: Contiguous axial images were obtained from the base of the skull through the vertex without intravenous contrast. COMPARISON:  MRI dated 10/30/2010 FINDINGS: The ventricles and the sulci are appropriate in size for the patient's age. There is no intracranial hemorrhage. No midline shift or mass effect identified. The gray-white matter differentiation is preserved. The visualized paranasal sinuses and mastoid air cells are well aerated. The calvarium is intact. IMPRESSION: No acute intracranial pathology. Electronically Signed   By: Elgie Collard M.D.   On: 09/26/2015 21:18    I have personally reviewed and evaluated these images and lab results as part of my medical decision-making.   EKG Interpretation   Date/Time:  Thursday September 26 2015 20:27:17 EDT Ventricular Rate:  87 PR Interval:  120 QRS Duration: 95 QT Interval:  364 QTC Calculation: 438 R Axis:   70 Text Interpretation:  Sinus rhythm Nonspecific T abnormalities Confirmed  by Denton Lank  MD, Caryn Bee  ([id number]) on 09/26/2015 8:36:01 PM     MDM   I personally performed the services described in this documentation, which was scribed in my presence. The recorded information has been reviewed and considered. Cathren Laine, MD   Iv ns.   Continuous pulse ox and monitor.  No focal neuro deficits on exam.   Labs. Imaging.  Ct neg. cxr neg. Initial trop neg.  Given cp, will plan delta trop.  Recheck pt comfortably. No headache. No cp. No numbness/weakness.  2253, pt content/nad. No new c/o.  Repeat trop pending.  Signed out to Dr Elesa Massed to check repeat trop,  if repeat trop neg, and symptoms resolved, feel stable for d/c w close outpt f/u.        Cathren Laine, MD 09/26/15 [id number]

## 2015-09-26 NOTE — ED Notes (Signed)
MD at bedside. 

## 2015-09-27 ENCOUNTER — Emergency Department (HOSPITAL_COMMUNITY): Payer: BLUE CROSS/BLUE SHIELD

## 2015-09-27 NOTE — ED Notes (Signed)
Taken to MRI at this time

## 2015-09-27 NOTE — ED Provider Notes (Signed)
[facility]. Patient had presented initially with chest pain but also complaining of numbness on the face. Patient had chronic evaluation including EKG and 2 troponins that were negative. It was felt that this was adequate workup for cardiac etiology of her symptoms, but based on the neurologic symptoms, was transferred here to have MRI to rule out CVA. MRI has been performed and is negative for acute stroke. Etiology of symptoms is unclear. I do not feel that this is consistent with a TIA, does not require any further workup. Will follow-up with primary doctor.  Gilda Creasehristopher J Dashae Wilcher, MD 09/27/15 [id number]

## 2015-09-27 NOTE — ED Notes (Signed)
[facility] . Respirations unlabored .

## 2016-10-13 ENCOUNTER — Emergency Department (HOSPITAL_BASED_OUTPATIENT_CLINIC_OR_DEPARTMENT_OTHER): Payer: BLUE CROSS/BLUE SHIELD

## 2016-10-13 ENCOUNTER — Encounter (HOSPITAL_BASED_OUTPATIENT_CLINIC_OR_DEPARTMENT_OTHER): Payer: Self-pay

## 2016-10-13 ENCOUNTER — Emergency Department (HOSPITAL_BASED_OUTPATIENT_CLINIC_OR_DEPARTMENT_OTHER)
Admission: EM | Admit: 2016-10-13 | Discharge: 2016-10-13 | Disposition: A | Discharge status: Home / Self Care | Payer: BLUE CROSS/BLUE SHIELD | Attending: Emergency Medicine | Admitting: Emergency Medicine

## 2016-10-13 DIAGNOSIS — Z5321 Procedure and treatment not carried out due to patient leaving prior to being seen by health care provider: Secondary | ICD-10-CM | POA: Diagnosis not present

## 2016-10-13 DIAGNOSIS — Z7901 Long term (current) use of anticoagulants: Secondary | ICD-10-CM | POA: Diagnosis not present

## 2016-10-13 DIAGNOSIS — R079 Chest pain, unspecified: Secondary | ICD-10-CM

## 2016-10-13 LAB — BASIC METABOLIC PANEL
Anion gap: 7 (ref 5–15)
BUN: 13 mg/dL (ref 6–20)
CALCIUM: 8.9 mg/dL (ref 8.9–10.3)
CHLORIDE: 108 mmol/L (ref 101–111)
CO2: 24 mmol/L (ref 22–32)
Creatinine, Ser: 1.08 mg/dL — ABNORMAL HIGH (ref 0.44–1.00)
GFR calc Af Amer: >60 mL/min (ref >60)
Glucose, Bld: 111 mg/dL — ABNORMAL HIGH (ref 65–99)
Potassium: 3.7 mmol/L (ref 3.5–5.1)
SODIUM: 139 mmol/L (ref 135–145)

## 2016-10-13 LAB — CBC
HCT: 32.6 % — ABNORMAL LOW (ref 36.0–46.0)
Hemoglobin: 10.6 g/dL — ABNORMAL LOW (ref 12.0–15.0)
MCH: 28.1 pg (ref 26.0–34.0)
MCHC: 32.5 g/dL (ref 30.0–36.0)
MCV: 86.5 fL (ref 78.0–100.0)
PLATELETS: 257 10*3/uL (ref 150–400)
RBC: 3.77 MIL/uL — ABNORMAL LOW (ref 3.87–5.11)
RDW: 13.3 % (ref 11.5–15.5)
WBC: 5 10*3/uL (ref 4.0–10.5)

## 2016-10-13 LAB — TROPONIN I: Troponin I: <0.03 ng/mL (ref <0.03)
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
## 2016-10-13 NOTE — ED Triage Notes (Signed)
CP x 1 hour-CP x 3 events over the last 6 months-NAD-presents to triage in w/c

## 2019-06-24 ENCOUNTER — Emergency Department (HOSPITAL_BASED_OUTPATIENT_CLINIC_OR_DEPARTMENT_OTHER): Payer: BC Managed Care – PPO

## 2019-06-24 ENCOUNTER — Emergency Department (HOSPITAL_BASED_OUTPATIENT_CLINIC_OR_DEPARTMENT_OTHER)
Admission: EM | Admit: 2019-06-24 | Discharge: 2019-06-24 | Disposition: A | Discharge status: Home / Self Care | Payer: BC Managed Care – PPO | Attending: Emergency Medicine | Admitting: Emergency Medicine

## 2019-06-24 ENCOUNTER — Other Ambulatory Visit: Payer: Self-pay

## 2019-06-24 ENCOUNTER — Encounter (HOSPITAL_BASED_OUTPATIENT_CLINIC_OR_DEPARTMENT_OTHER): Payer: Self-pay

## 2019-06-24 DIAGNOSIS — R519 Headache, unspecified: Secondary | ICD-10-CM | POA: Diagnosis not present

## 2019-06-24 DIAGNOSIS — R0789 Other chest pain: Secondary | ICD-10-CM | POA: Diagnosis not present

## 2019-06-24 DIAGNOSIS — Z7901 Long term (current) use of anticoagulants: Secondary | ICD-10-CM | POA: Insufficient documentation

## 2019-06-24 DIAGNOSIS — Z20822 Contact with and (suspected) exposure to covid-19: Secondary | ICD-10-CM

## 2019-06-24 DIAGNOSIS — R05 Cough: Secondary | ICD-10-CM

## 2019-06-24 DIAGNOSIS — U071 COVID-19: Secondary | ICD-10-CM | POA: Diagnosis not present

## 2019-06-24 DIAGNOSIS — R059 Cough, unspecified: Secondary | ICD-10-CM

## 2019-06-24 DIAGNOSIS — G4700 Insomnia, unspecified: Secondary | ICD-10-CM | POA: Insufficient documentation

## 2019-06-24 DIAGNOSIS — R079 Chest pain, unspecified: Secondary | ICD-10-CM

## 2019-06-24 DIAGNOSIS — K5900 Constipation, unspecified: Secondary | ICD-10-CM

## 2019-06-24 LAB — CBC WITH DIFFERENTIAL/PLATELET
Abs Immature Granulocytes: 0.02 10*3/uL (ref 0.00–0.07)
Basophils Absolute: 0 10*3/uL (ref 0.0–0.1)
Basophils Relative: 1 %
Eosinophils Absolute: 0 10*3/uL (ref 0.0–0.5)
Eosinophils Relative: 1 %
HCT: 37.7 % (ref 36.0–46.0)
Hemoglobin: 11.6 g/dL — ABNORMAL LOW (ref 12.0–15.0)
Immature Granulocytes: 1 %
Lymphocytes Relative: 49 %
Lymphs Abs: 2 10*3/uL (ref 0.7–4.0)
MCH: 26.3 pg (ref 26.0–34.0)
MCHC: 30.8 g/dL (ref 30.0–36.0)
MCV: 85.5 fL (ref 80.0–100.0)
Monocytes Absolute: 0.3 10*3/uL (ref 0.1–1.0)
Monocytes Relative: 7 %
Neutro Abs: 1.5 10*3/uL — ABNORMAL LOW (ref 1.7–7.7)
Neutrophils Relative %: 41 %
Platelets: 226 10*3/uL (ref 150–400)
RBC: 4.41 MIL/uL (ref 3.87–5.11)
RDW: 19.2 % — ABNORMAL HIGH (ref 11.5–15.5)
WBC: 3.8 10*3/uL — ABNORMAL LOW (ref 4.0–10.5)
nRBC: 0 % (ref 0.0–0.2)

## 2019-06-24 LAB — COMPREHENSIVE METABOLIC PANEL
ALT: 15 U/L (ref 0–44)
AST: 24 U/L (ref 15–41)
Albumin: 3.9 g/dL (ref 3.5–5.0)
Alkaline Phosphatase: 63 U/L (ref 38–126)
Anion gap: 9 (ref 5–15)
BUN: 7 mg/dL (ref 6–20)
CO2: 23 mmol/L (ref 22–32)
Calcium: 8.6 mg/dL — ABNORMAL LOW (ref 8.9–10.3)
Chloride: 104 mmol/L (ref 98–111)
Creatinine, Ser: 1.08 mg/dL — ABNORMAL HIGH (ref 0.44–1.00)
GFR calc Af Amer: >60 mL/min (ref >60)
GFR calc non Af Amer: >60 mL/min (ref >60)
Glucose, Bld: 99 mg/dL (ref 70–99)
Potassium: 4.1 mmol/L (ref 3.5–5.1)
Sodium: 136 mmol/L (ref 135–145)
Total Bilirubin: 0.8 mg/dL (ref 0.3–1.2)
Total Protein: 7.5 g/dL (ref 6.5–8.1)

## 2019-06-24 LAB — TROPONIN I (HIGH SENSITIVITY)
Troponin I (High Sensitivity): <2 ng/L (ref <18)
Troponin I (High Sensitivity): <2 ng/L (ref <18)

## 2019-06-24 LAB — PREGNANCY, URINE: Preg Test, Ur: NEGATIVE

## 2019-06-24 MED ORDER — IOHEXOL 350 MG/ML SOLN
100.0000 mL | Freq: Once | INTRAVENOUS | Status: AC | PRN
  Administered 2019-06-24: 10:00:00 100 mL via INTRAVENOUS

## 2019-06-24 MED ORDER — ALPRAZOLAM 0.5 MG PO TABS: 0.5000 mg | ORAL_TABLET | Freq: Every evening | ORAL | 0 refills | Status: AC | PRN

## 2019-06-24 MED ORDER — SODIUM CHLORIDE 0.9 % IV BOLUS
1000.0000 mL | Freq: Once | INTRAVENOUS | Status: AC
  Administered 2019-06-24: 09:00:00 1000 mL via INTRAVENOUS

## 2019-06-24 MED ORDER — LACTULOSE ENCEPHALOPATHY 10 GM/15ML PO SOLN: 10.0000 g | Freq: Two times a day (BID) | ORAL | 0 refills | Status: AC | PRN

## 2019-06-24 NOTE — ED Triage Notes (Signed)
Pt states that she has been feeling sick for about 2 weeks. States that her son has had Covid, her Covid was negative but her Flu was positive. Pt also reports some SOB and productive cough. Ambulatory to room, VSS.

## 2019-06-24 NOTE — ED Provider Notes (Signed)
[facility] EMERGENCY DEPARTMENT Provider Note   CSN: [id number] Arrival date & time: 06/24/19  5643     History Chief Complaint  Patient presents with  . Fatigue  . Cough    Helen Vincent is a 43 y.o. female.  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  She has multiple complaints today.  She has had about 10 days of cough.  She is feels short of breath.  Her son is Covid positive but she tested negative although tested positive for flu.  She is complaining of not having a bowel movement for a week and having crampy abdominal pain.  No improvement with MiraLAX.  She is also complaining of poor sleep.  She self treated with prednisone for the last few days with some improvement in her cough.  She said today she woke up with some left-sided chest pain that felt like a bee sting in her.  No diaphoresis.  She is also complaining of feeling very anxious.  She said she skipped about a week of taking her Xarelto because she was very busy.  Also complaining of daily headaches.  No numbness or weakness.  No blurry vision double vision.  The history is provided by the patient.  Cough Cough characteristics:  Non-productive Severity:  Moderate Onset quality:  Gradual Duration:  10 days Timing:  Intermittent Progression:  Improving Chronicity:  New Context: sick contacts   Relieved by:  Nothing Worsened by:  Nothing Ineffective treatments:  None tried Associated symptoms: chest pain, headaches and shortness of breath   Associated symptoms: no ear fullness, no fever, no rash, no sore throat and no weight loss   Chest pain:    Quality: stabbing     Severity:  Moderate   Onset quality:  Sudden   Timing:  Constant   Progression:  Unchanged   Chronicity:  New      Past Medical History:  Diagnosis Date  . Pulmonary embolism (HCC)     There are no problems to display for this patient.   Past Surgical History:  Procedure Laterality Date  . CESAREAN SECTION    .  FINGER SURGERY    . lypoma       OB History   No obstetric history on file.     No family history on file.  Social History   Tobacco Use  . Smoking status: Never Smoker  . Smokeless tobacco: Never Used  Substance Use Topics  . Alcohol use: Yes    Comment: occ  . Drug use: No    Home Medications Prior to Admission medications   Medication Sig Start Date End Date Taking? Authorizing Provider  rivaroxaban (XARELTO) 20 MG TABS tablet Take 20 mg by mouth daily with supper.    [provider]    Allergies    Tylenol with codeine #3 [acetaminophen-codeine], Codeine, and Shellfish allergy  Review of Systems   Review of Systems  Constitutional: Negative for fever and weight loss.  HENT: Negative for sore throat.   Eyes: Negative for visual disturbance.  Respiratory: Positive for cough and shortness of breath.   Cardiovascular: Positive for chest pain.  Gastrointestinal: Positive for abdominal pain and constipation. Negative for nausea and vomiting.  Genitourinary: Negative for dysuria.  Musculoskeletal: Negative for back pain.  Skin: Negative for rash.  Neurological: Positive for headaches. Negative for syncope, speech difficulty, weakness and numbness.    Physical Exam Updated Vital Signs BP 106/75 (BP Location: Right Arm)  Pulse 64   Temp 98.8 F (37.1 C) (Oral)   Resp 20   Ht  (1.702 m)   Wt 92.5 kg   LMP 06/13/2019   SpO2 100%   BMI 31.95 kg/m   Physical Exam Vitals and nursing note reviewed.  Constitutional:      General: She is not in acute distress.    Appearance: She is well-developed.  HENT:     Head: Normocephalic and atraumatic.  Eyes:     Conjunctiva/sclera: Conjunctivae normal.  Cardiovascular:     Rate and Rhythm: Normal rate and regular rhythm.     Heart sounds: No murmur.  Pulmonary:     Effort: Pulmonary effort is normal. No respiratory distress.     Breath sounds: Normal breath sounds.  Abdominal:     Palpations:  Abdomen is soft.     Tenderness: There is no abdominal tenderness.  Musculoskeletal:        General: Normal range of motion.     Cervical back: Neck supple.     Right lower leg: No edema.     Left lower leg: No edema.  Skin:    General: Skin is warm and dry.     Capillary Refill: Capillary refill takes less than 2 seconds.  Neurological:     General: No focal deficit present.     Mental Status: She is alert.     Sensory: No sensory deficit.     Motor: No weakness.     Gait: Gait normal.     ED Results / Procedures / Treatments   Labs (all labs ordered are listed, but only abnormal results are displayed) Labs Reviewed  COMPREHENSIVE METABOLIC PANEL - Abnormal; Notable for the following components:      Result Value   Creatinine, Ser 1.08 (*)    Calcium 8.6 (*)    All other components within normal limits  CBC WITH DIFFERENTIAL/PLATELET - Abnormal; Notable for the following components:   WBC 3.8 (*)    Hemoglobin 11.6 (*)    RDW 19.2 (*)    Neutro Abs 1.5 (*)    All other components within normal limits  NOVEL CORONAVIRUS, NAA (HOSP ORDER, SEND-OUT TO REF LAB; TAT 18-24 HRS)  PREGNANCY, URINE  TROPONIN I (HIGH SENSITIVITY)  TROPONIN I (HIGH SENSITIVITY)    EKG EKG Interpretation  Date/Time:  Saturday June 24 2019 08:20:04 EST Ventricular Rate:  86 PR Interval:    QRS Duration: 89 QT Interval:  395 QTC Calculation: 473 R Axis:   60 Text Interpretation: Sinus rhythm Low voltage, precordial leads Borderline T abnormalities, diffuse leads similar to prior 4/18 Confirmed by Meridee Score [phone]) on 06/24/2019 8:22:46 AM   Radiology CT Head Wo Contrast  Result Date: 06/24/2019 CLINICAL DATA:  Headache for 10 days. EXAM: CT HEAD WITHOUT CONTRAST TECHNIQUE: Contiguous axial images were obtained from the base of the skull through the vertex without intravenous contrast. COMPARISON:  September 26, 2015 FINDINGS: Brain: No evidence of acute infarction, hemorrhage,  hydrocephalus, extra-axial collection or mass lesion/mass effect. Vascular: No hyperdense vessel is noted. Skull: Normal. Negative for fracture or focal lesion. Sinuses/Orbits: No acute finding. Other: None. IMPRESSION: No focal acute intracranial abnormality identified. Electronically Signed   By: Sherian Rein M.D.   On: 06/24/2019 10:29   CT Angio Chest PE W/Cm &/Or Wo Cm  Result Date: 06/24/2019 CLINICAL DATA:  43 year old female with influenza, fever, chills, headache, nausea, vomiting, a past history of pulmonary embolism and recent exposure to COVID.  EXAM: CT ANGIOGRAPHY CHEST CT ABDOMEN AND PELVIS WITH CONTRAST TECHNIQUE: Multidetector CT imaging of the chest was performed using the standard protocol during bolus administration of intravenous contrast. Multiplanar CT image reconstructions and MIPs were obtained to evaluate the vascular anatomy. Multidetector CT imaging of the abdomen and pelvis was performed using the standard protocol during bolus administration of intravenous contrast. CONTRAST:  OMNIPAQUE IOHEXOL 350 MG/ML SOLN COMPARISON:  Prior CTA chest 03/15/2017 FINDINGS: CTA CHEST FINDINGS Cardiovascular: 2 vessel aortic arch. The right brachiocephalic and left common carotid artery share a common origin. The pulmonary arteries are well opacified to the proximal segmental level. No evidence of central filling defect to suggest acute pulmonary embolus. The heart is normal in size. No pericardial effusion. Mediastinum/Nodes: Unremarkable CT appearance of the thyroid gland. No suspicious mediastinal or hilar adenopathy. No soft tissue mediastinal mass. The thoracic esophagus is unremarkable. Lungs/Pleura: Scattered bilateral predominantly peripheral interstitial and airspace opacities involving all lobes of both lungs with a lower lobe predominant pattern. No evidence of pulmonary edema, pleural effusion or pneumothorax. Musculoskeletal: No acute fracture or aggressive appearing lytic or  blastic osseous lesion. Review of the MIP images confirms the above findings. CT ABDOMEN and PELVIS FINDINGS Hepatobiliary: No focal liver abnormality is seen. No gallstones, gallbladder wall thickening, or biliary dilatation. Pancreas: Unremarkable. No pancreatic ductal dilatation or surrounding inflammatory changes. Spleen: Normal in size without focal abnormality. Adrenals/Urinary Tract: Adrenal glands are unremarkable. Kidneys are normal, without renal calculi, focal lesion, or hydronephrosis. Bladder is unremarkable. Stomach/Bowel: Stomach is within normal limits. Appendix appears normal. No evidence of bowel wall thickening, distention, or inflammatory changes. Vascular/Lymphatic: No significant vascular findings are present. No enlarged abdominal or pelvic lymph nodes. Reproductive: The uterus is diffusely enlarged. There are several areas of circumscribed hypoattenuation consistent with uterine leiomyomas. However, the endometrial cavity is also diffusely irregular with innumerable small subcentimeter low-attenuation foci speckled throughout the uterine myometrium adjacent to the endometrial canal. Additionally, there is a 2 cm solid enhancing mass within the endometrial canal. Other: No abdominal wall hernia or abnormality. No abdominopelvic ascites. Musculoskeletal: No acute fracture or aggressive appearing lytic or blastic osseous lesion. Review of the MIP images confirms the above findings. IMPRESSION: CTA CHEST 1. Bilateral peripheral and basilar ground-glass attenuation airspace opacities with associated bilateral lower lobe atelectasis. Findings are consistent with viral pneumonia including COVID-19, particularly given patient's recent exposure. 2. No evidence of acute pulmonary embolus. CT ABD/PELVIS 1. No acute abnormality within the abdomen or pelvis. 2. Abnormally enlarged uterus with multiple definitive uterine fibroids and additional findings of innumerable subcentimeter low-attenuation lesions  scattered throughout the myometrium around the periphery of the endometrial canal. Differential considerations include adenomyosis, diffuse uterine leiomyomatosis, and potentially endometrial carcinoma. Recommend non emergent referral to OBGYN for further evaluation. 3. Approximately 2 cm enhancing mass within the endometrial cavity may represent a pedunculated submucosal uterine fibroid, endometrial polyp, or endometrial neoplasm. A pedunculated uterine fibroid is favored. Electronically Signed   By: Malachy Moan M.D.   On: 06/24/2019 10:43   CT Abdomen Pelvis W Contrast  Result Date: 06/24/2019 CLINICAL DATA:  43 year old female with influenza, fever, chills, headache, nausea, vomiting, a past history of pulmonary embolism and recent exposure to COVID. EXAM: CT ANGIOGRAPHY CHEST CT ABDOMEN AND PELVIS WITH CONTRAST TECHNIQUE: Multidetector CT imaging of the chest was performed using the standard protocol during bolus administration of intravenous contrast. Multiplanar CT image reconstructions and MIPs were obtained to evaluate the vascular anatomy. Multidetector CT imaging of the abdomen  and pelvis was performed using the standard protocol during bolus administration of intravenous contrast. CONTRAST:  100mL OMNIPAQUE IOHEXOL 350 MG/ML SOLN COMPARISON:  Prior CTA chest 03/15/2017 FINDINGS: CTA CHEST FINDINGS Cardiovascular: 2 vessel aortic arch. The right brachiocephalic and left common carotid artery share a common origin. The pulmonary arteries are well opacified to the proximal segmental level. No evidence of central filling defect to suggest acute pulmonary embolus. The heart is normal in size. No pericardial effusion. Mediastinum/Nodes: Unremarkable CT appearance of the thyroid gland. No suspicious mediastinal or hilar adenopathy. No soft tissue mediastinal mass. The thoracic esophagus is unremarkable. Lungs/Pleura: Scattered bilateral predominantly peripheral interstitial and airspace opacities  involving all lobes of both lungs with a lower lobe predominant pattern. No evidence of pulmonary edema, pleural effusion or pneumothorax. Musculoskeletal: No acute fracture or aggressive appearing lytic or blastic osseous lesion. Review of the MIP images confirms the above findings. CT ABDOMEN and PELVIS FINDINGS Hepatobiliary: No focal liver abnormality is seen. No gallstones, gallbladder wall thickening, or biliary dilatation. Pancreas: Unremarkable. No pancreatic ductal dilatation or surrounding inflammatory changes. Spleen: Normal in size without focal abnormality. Adrenals/Urinary Tract: Adrenal glands are unremarkable. Kidneys are normal, without renal calculi, focal lesion, or hydronephrosis. Bladder is unremarkable. Stomach/Bowel: Stomach is within normal limits. Appendix appears normal. No evidence of bowel wall thickening, distention, or inflammatory changes. Vascular/Lymphatic: No significant vascular findings are present. No enlarged abdominal or pelvic lymph nodes. Reproductive: The uterus is diffusely enlarged. There are several areas of circumscribed hypoattenuation consistent with uterine leiomyomas. However, the endometrial cavity is also diffusely irregular with innumerable small subcentimeter low-attenuation foci speckled throughout the uterine myometrium adjacent to the endometrial canal. Additionally, there is a 2 cm solid enhancing mass within the endometrial canal. Other: No abdominal wall hernia or abnormality. No abdominopelvic ascites. Musculoskeletal: No acute fracture or aggressive appearing lytic or blastic osseous lesion. Review of the MIP images confirms the above findings. IMPRESSION: CTA CHEST 1. Bilateral peripheral and basilar ground-glass attenuation airspace opacities with associated bilateral lower lobe atelectasis. Findings are consistent with viral pneumonia including COVID-19, particularly given patient's recent exposure. 2. No evidence of acute pulmonary embolus. CT  ABD/PELVIS 1. No acute abnormality within the abdomen or pelvis. 2. Abnormally enlarged uterus with multiple definitive uterine fibroids and additional findings of innumerable subcentimeter low-attenuation lesions scattered throughout the myometrium around the periphery of the endometrial canal. Differential considerations include adenomyosis, diffuse uterine leiomyomatosis, and potentially endometrial carcinoma. Recommend non emergent referral to OBGYN for further evaluation. 3. Approximately 2 cm enhancing mass within the endometrial cavity may represent a pedunculated submucosal uterine fibroid, endometrial polyp, or endometrial neoplasm. A pedunculated uterine fibroid is favored. Electronically Signed   By: Malachy MoanHeath  McCullough M.D.   On: 06/24/2019 10:43    Procedures Procedures (including critical care time)  Medications Ordered in ED Medications  sodium chloride 0.9 % bolus 1,000 mL ( Intravenous Stopped 06/24/19 0940)  iohexol (OMNIPAQUE) 350 MG/ML injection 100 mL (100 mLs Intravenous Contrast Given 06/24/19 0948)    ED Course  I have reviewed the triage vital signs and the nursing notes.  Pertinent labs & imaging results that were available during my care of the patient were reviewed by me and considered in my medical decision making (see chart for details).  Clinical Course as of Jun 24 1715  Sat Jun 24, 2019  [id number] 43 year old female with multiple medical complaints including headache chest pain shortness of breath constipation insomnia and concern for having Covid.  She also skipped her  weeks worth of taking her anticoagulation.  I put her in for head CT because she is concerned she has had a leaking aneurysm in her head, chest CT for concern that she might of had a PE while not taking her medication, abdominal CT due to her crampy abdominal pain and concern that she is constipated.   [MB]  [id number] Blood pressure low here but looks like she runs around low 100 at baseline.  IV fluids  infusing.   [MB]  1052 Patient CTs do not show any critical findings.  Viral signs of pneumonia expected and probably Covid.  CT abdomen and pelvis showing some uterine fibroids patient is aware of and follows with gynecology for this.   [MB]  1128 Delta troponin negative.  Reviewed results with patient and she is comfortable with plan for discharge   [MB]    Clinical Course User Index [MB] Terrilee Files, MD   MDM Rules/Calculators/A&P                      Mahira DEON IVEY was evaluated in Emergency Department on 06/24/2019 for the symptoms described in the history of present illness. She was evaluated in the context of the global COVID-19 pandemic, which necessitated consideration that the patient might be at risk for infection with the SARS-CoV-2 virus that causes COVID-19. Institutional protocols and algorithms that pertain to the evaluation of patients at risk for COVID-19 are in a state of rapid change based on information released by regulatory bodies including the CDC and federal and state organizations. These policies and algorithms were followed during the patient's care in the ED.  Final Clinical Impression(s) / ED Diagnoses Final diagnoses:  Person under investigation for COVID-19  Acute nonintractable headache, unspecified headache type  Cough  Constipation, unspecified constipation type  Insomnia, unspecified type  Nonspecific chest pain    Rx / DC Orders ED Discharge Orders         Ordered    lactulose, encephalopathy, (CHRONULAC) 10 GM/15ML SOLN  2 times daily PRN     06/24/19 1056    ALPRAZolam (XANAX) 0.5 MG tablet  At bedtime PRN     06/24/19 1056           Terrilee Files, MD 06/24/19 1717

## 2019-06-24 NOTE — ED Notes (Signed)
ED Provider at bedside. 

## 2019-06-24 NOTE — Discharge Instructions (Addendum)
You were seen in the emergency department for evaluation of multiple complaints including headache chest pain shortness of breath constipation and insomnia.  Your work-up did not show any serious findings that would require you to be admitted.  Your CAT scan of your abdomen showed abnormalities in your uterus that will need to be followed up with your gynecologist.  The CT report is included below.  You were tested for Covid and will need to isolate until your test results are back.  If you are Covid positive you will need to isolate for at least 10 days.  Please follow-up with your doctor and return to the emergency department for any worsening symptoms.  1. No acute abnormality within the abdomen or pelvis.  2. Abnormally enlarged uterus with multiple definitive uterine  fibroids and additional findings of innumerable subcentimeter  low-attenuation lesions scattered throughout the myometrium around  the periphery of the endometrial canal. Differential considerations  include adenomyosis, diffuse uterine leiomyomatosis, and potentially  endometrial carcinoma. Recommend non emergent referral to OBGYN for  further evaluation.  3. Approximately 2 cm enhancing mass within the endometrial cavity  may represent a pedunculated submucosal uterine fibroid, endometrial  polyp, or endometrial neoplasm. A pedunculated uterine fibroid is  favored.

## 2019-06-26 LAB — NOVEL CORONAVIRUS, NAA (HOSP ORDER, SEND-OUT TO REF LAB; TAT 18-24 HRS): SARS-CoV-2, NAA: DETECTED — AB

## 2019-11-29 ENCOUNTER — Observation Stay (HOSPITAL_BASED_OUTPATIENT_CLINIC_OR_DEPARTMENT_OTHER)
Admission: EM | Admit: 2019-11-29 | Discharge: 2019-11-29 | Disposition: A | Discharge status: Home / Self Care | Payer: BC Managed Care – PPO | Attending: Internal Medicine | Admitting: Internal Medicine

## 2019-11-29 ENCOUNTER — Emergency Department (HOSPITAL_BASED_OUTPATIENT_CLINIC_OR_DEPARTMENT_OTHER): Payer: BC Managed Care – PPO

## 2019-11-29 ENCOUNTER — Encounter (HOSPITAL_BASED_OUTPATIENT_CLINIC_OR_DEPARTMENT_OTHER): Payer: Self-pay | Admitting: Emergency Medicine

## 2019-11-29 ENCOUNTER — Other Ambulatory Visit: Payer: Self-pay

## 2019-11-29 DIAGNOSIS — D649 Anemia, unspecified: Principal | ICD-10-CM | POA: Insufficient documentation

## 2019-11-29 DIAGNOSIS — Z7901 Long term (current) use of anticoagulants: Secondary | ICD-10-CM | POA: Diagnosis not present

## 2019-11-29 DIAGNOSIS — Z5329 Procedure and treatment not carried out because of patient's decision for other reasons: Secondary | ICD-10-CM | POA: Diagnosis not present

## 2019-11-29 DIAGNOSIS — R55 Syncope and collapse: Secondary | ICD-10-CM | POA: Insufficient documentation

## 2019-11-29 DIAGNOSIS — R519 Headache, unspecified: Secondary | ICD-10-CM

## 2019-11-29 LAB — BASIC METABOLIC PANEL
Anion gap: 7 (ref 5–15)
BUN: 12 mg/dL (ref 6–20)
CO2: 22 mmol/L (ref 22–32)
Calcium: 8.9 mg/dL (ref 8.9–10.3)
Chloride: 105 mmol/L (ref 98–111)
Creatinine, Ser: 0.87 mg/dL (ref 0.44–1.00)
GFR calc Af Amer: >60 mL/min (ref >60)
GFR calc non Af Amer: >60 mL/min (ref >60)
Glucose, Bld: 99 mg/dL (ref 70–99)
Potassium: 4 mmol/L (ref 3.5–5.1)
Sodium: 134 mmol/L — ABNORMAL LOW (ref 135–145)

## 2019-11-29 LAB — CBC WITH DIFFERENTIAL/PLATELET
Abs Immature Granulocytes: 0.03 10*3/uL (ref 0.00–0.07)
Basophils Absolute: 0 10*3/uL (ref 0.0–0.1)
Basophils Relative: 1 %
Eosinophils Absolute: 0.2 10*3/uL (ref 0.0–0.5)
Eosinophils Relative: 3 %
HCT: 20 % — ABNORMAL LOW (ref 36.0–46.0)
Hemoglobin: 6.1 g/dL — CL (ref 12.0–15.0)
Immature Granulocytes: 1 %
Lymphocytes Relative: 31 %
Lymphs Abs: 1.8 10*3/uL (ref 0.7–4.0)
MCH: 26.8 pg (ref 26.0–34.0)
MCHC: 30.5 g/dL (ref 30.0–36.0)
MCV: 87.7 fL (ref 80.0–100.0)
Monocytes Absolute: 0.6 10*3/uL (ref 0.1–1.0)
Monocytes Relative: 10 %
Neutro Abs: 3.3 10*3/uL (ref 1.7–7.7)
Neutrophils Relative %: 54 %
Platelets: 264 10*3/uL (ref 150–400)
RBC: 2.28 MIL/uL — ABNORMAL LOW (ref 3.87–5.11)
RDW: 14.8 % (ref 11.5–15.5)
WBC: 5.9 10*3/uL (ref 4.0–10.5)
nRBC: 1 % — ABNORMAL HIGH (ref 0.0–0.2)

## 2019-11-29 LAB — URINALYSIS, ROUTINE W REFLEX MICROSCOPIC
Bilirubin Urine: NEGATIVE
Glucose, UA: NEGATIVE mg/dL
Hgb urine dipstick: NEGATIVE
Ketones, ur: NEGATIVE mg/dL
Leukocytes,Ua: NEGATIVE
Nitrite: NEGATIVE
Protein, ur: NEGATIVE mg/dL
Specific Gravity, Urine: >1.03 — ABNORMAL HIGH (ref 1.005–1.030)
pH: 6 (ref 5.0–8.0)

## 2019-11-29 LAB — PREGNANCY, URINE: Preg Test, Ur: NEGATIVE

## 2019-11-29 LAB — CBG MONITORING, ED: Glucose-Capillary: 92 mg/dL (ref 70–99)

## 2019-11-29 MED ORDER — IOHEXOL 350 MG/ML SOLN
100.0000 mL | Freq: Once | INTRAVENOUS | Status: AC | PRN
  Administered 2019-11-29: 100 mL via INTRAVENOUS

## 2019-11-29 MED ORDER — DIPHENHYDRAMINE HCL 50 MG/ML IJ SOLN
25.0000 mg | Freq: Once | INTRAMUSCULAR | Status: AC
  Administered 2019-11-29: 25 mg via INTRAVENOUS
  Filled 2019-11-29: qty 1

## 2019-11-29 MED ORDER — PROCHLORPERAZINE EDISYLATE 10 MG/2ML IJ SOLN
10.0000 mg | Freq: Once | INTRAMUSCULAR | Status: AC
  Administered 2019-11-29: 10 mg via INTRAVENOUS
  Filled 2019-11-29: qty 2

## 2019-11-29 MED ORDER — SODIUM CHLORIDE 0.9 % IV BOLUS
1000.0000 mL | Freq: Once | INTRAVENOUS | Status: AC
  Administered 2019-11-29: 1000 mL via INTRAVENOUS

## 2019-11-29 NOTE — ED Provider Notes (Signed)
[facility] EMERGENCY DEPARTMENT Provider Note   CSN: [id number] Arrival date & time: 11/29/19  1504     History Chief Complaint  Patient presents with  . Headache  . Loss of Consciousness    Helen Vincent is a 44 y.o. female.  44 yo F with a chief complaints of a headache.  This been going on for about 3 weeks.  Occurs off and on.  Seems to be worse with head movement.  She was at work today and felt like her headache had gotten somewhat worse.  Usually is in the frontal or temporal regions.  She says it feels like her brain is floating inside of her head.  Feels like the pain is behind her forehead and also behind the right eye.  Never had a headache like this before.  She denies trauma denies fever.  Has had some left-sided neck pain with this off and on.  Today when the headache occurred she tried to ambulate to the door and she suddenly fell like she was in a pass out and then did. Got back to her chair and continued to work.  Went home and then felt like she wanted to sleep, but her family encouraged her to come   The history is provided by the patient.  Headache Pain location:  Frontal Severity currently:  8/10 Severity at highest:  8/10 Onset quality:  Gradual Duration:  3 weeks Timing:  Constant Progression:  Worsening Chronicity:  New Similar to prior headaches: no   Relieved by:  Nothing Worsened by:  Nothing Ineffective treatments:  None tried Associated symptoms: syncope   Associated symptoms: no congestion, no dizziness, no fever, no myalgias, no nausea and no vomiting   Loss of Consciousness Associated symptoms: headaches   Associated symptoms: no chest pain, no dizziness, no fever, no nausea, no palpitations, no shortness of breath and no vomiting        Past Medical History:  Diagnosis Date  . Pulmonary embolism [facility])     Patient Active Problem List   Diagnosis Date Noted  . Syncope 11/29/2019    Past Surgical History:  Procedure  Laterality Date  . CESAREAN SECTION    . FINGER SURGERY    . lypoma       OB History   No obstetric history on file.     No family history on file.  Social History   Tobacco Use  . Smoking status: Never Smoker  . Smokeless tobacco: Never Used  Substance Use Topics  . Alcohol use: Yes    Comment: occ  . Drug use: No    Home Medications Prior to Admission medications   Medication Sig Start Date End Date Taking? Authorizing Provider  ALPRAZolam Prudy Feeler) 0.5 MG tablet Take 1 tablet (0.5 mg total) by mouth at bedtime as needed for sleep. 06/24/19   Terrilee Files, MD  lactulose, encephalopathy, (CHRONULAC) 10 GM/15ML SOLN Take 15 mLs (10 g total) by mouth 2 (two) times daily as needed (constipation). 06/24/19   Terrilee Files, MD  rivaroxaban (XARELTO) 20 MG TABS tablet Take 20 mg by mouth daily with supper.    [provider]    Allergies    Tylenol with codeine #3 [acetaminophen-codeine], Codeine, and Shellfish allergy  Review of Systems   Review of Systems  Constitutional: Negative for chills and fever.  HENT: Negative for congestion and rhinorrhea.   Eyes: Negative for redness and visual disturbance.  Respiratory: Negative for shortness of breath  and wheezing.   Cardiovascular: Positive for syncope. Negative for chest pain and palpitations.  Gastrointestinal: Negative for nausea and vomiting.  Genitourinary: Negative for dysuria and urgency.  Musculoskeletal: Negative for arthralgias and myalgias.  Skin: Negative for pallor and wound.  Neurological: Positive for syncope and headaches. Negative for dizziness.    Physical Exam Updated Vital Signs BP 113/75 (BP Location: Right Arm)   Pulse 86   Temp 98 F (36.7 C) (Oral)   Resp 20   Ht 5\' 7"  (1.702 m)   Wt 93.9 kg   LMP 11/06/2019 (Approximate)   SpO2 100%   BMI 32.42 kg/m   Physical Exam Vitals and nursing note reviewed.  Constitutional:      General: She is not in acute distress.     Appearance: She is well-developed. She is not diaphoretic.     Comments: Marked pallor  HENT:     Head: Normocephalic and atraumatic.  Eyes:     Pupils: Pupils are equal, round, and reactive to light.  Cardiovascular:     Rate and Rhythm: Normal rate and regular rhythm.     Heart sounds: No murmur. No friction rub. No gallop.   Pulmonary:     Effort: Pulmonary effort is normal.     Breath sounds: No wheezing or rales.  Abdominal:     General: There is no distension.     Palpations: Abdomen is soft.     Tenderness: There is no abdominal tenderness.  Musculoskeletal:        General: No tenderness.     Cervical back: Normal range of motion and neck supple.  Skin:    General: Skin is warm and dry.  Neurological:     Mental Status: She is alert and oriented to person, place, and time.     Cranial Nerves: Cranial nerves are intact.     Sensory: Sensation is intact.     Motor: Motor function is intact.     Coordination: Coordination is intact.  Psychiatric:        Behavior: Behavior normal.     ED Results / Procedures / Treatments   Labs (all labs ordered are listed, but only abnormal results are displayed) Labs Reviewed  URINALYSIS, ROUTINE W REFLEX MICROSCOPIC - Abnormal; Notable for the following components:      Result Value   Specific Gravity, Urine >1.030 (*)    All other components within normal limits  CBC WITH DIFFERENTIAL/PLATELET - Abnormal; Notable for the following components:   RBC 2.28 (*)    Hemoglobin 6.1 (*)    HCT 20.0 (*)    nRBC 1.0 (*)    All other components within normal limits  BASIC METABOLIC PANEL - Abnormal; Notable for the following components:   Sodium 134 (*)    All other components within normal limits  SARS CORONAVIRUS 2 BY RT PCR (HOSPITAL ORDER, PERFORMED IN [place] [facility])  PREGNANCY, URINE  CBG MONITORING, ED  CBG MONITORING, ED    EKG None  Radiology CT Angio Head W or Wo Contrast  Result Date: 11/29/2019 CLINICAL  DATA:  Syncope. Dizziness and nausea. Subarachnoid hemorrhage suspected. EXAM: CT ANGIOGRAPHY HEAD AND NECK TECHNIQUE: Multidetector CT imaging of the head and neck was performed using the standard protocol during bolus administration of intravenous contrast. Multiplanar CT image reconstructions and MIPs were obtained to evaluate the vascular anatomy. Carotid stenosis measurements (when applicable) are obtained utilizing NASCET criteria, using the distal internal carotid diameter as the denominator. CONTRAST:  OMNIPAQUE IOHEXOL 350 MG/ML SOLN COMPARISON:  CT head 06/24/2019 FINDINGS: CT HEAD FINDINGS Brain: No evidence of acute infarction, hemorrhage, hydrocephalus, extra-axial collection or mass lesion/mass effect. Vascular: Negative for hyperdense vessel Skull: Negative Sinuses: Negative Orbits: Negative Review of the MIP images confirms the above findings CTA NECK FINDINGS Aortic arch: Bovine branching pattern. Proximal great vessels widely patent. Negative aortic arch. Right carotid system: Normal right carotid. Negative for stenosis or dissection Left carotid system: Normal left carotid. Negative for stenosis or dissection Vertebral arteries: Both vertebral arteries normal and widely patent. Skeleton: Cervical degenerative changes.  No acute skeletal lesion. Other neck: Negative for mass or adenopathy in the neck. Upper chest: Lung apices clear bilaterally. Review of the MIP images confirms the above findings CTA HEAD FINDINGS Anterior circulation: Normal cavernous carotid bilaterally. No significant atherosclerotic disease or stenosis. Anterior and middle cerebral arteries widely patent bilaterally. No aneurysm. Posterior circulation: Both vertebral arteries patent to the basilar. PICA patent bilaterally. Basilar widely patent. Superior cerebellar and posterior cerebral arteries patent bilaterally. Negative for aneurysm. Venous sinuses: Normal venous enhancement. Anatomic variants: None Review of the MIP  images confirms the above findings IMPRESSION: 1. Normal CT head 2. Normal CTA head neck. Electronically Signed   By: Marlan Palau M.D.   On: 11/29/2019 16:29   CT Angio Neck W and/or Wo Contrast  Result Date: 11/29/2019 CLINICAL DATA:  Syncope. Dizziness and nausea. Subarachnoid hemorrhage suspected. EXAM: CT ANGIOGRAPHY HEAD AND NECK TECHNIQUE: Multidetector CT imaging of the head and neck was performed using the standard protocol during bolus administration of intravenous contrast. Multiplanar CT image reconstructions and MIPs were obtained to evaluate the vascular anatomy. Carotid stenosis measurements (when applicable) are obtained utilizing NASCET criteria, using the distal internal carotid diameter as the denominator. CONTRAST:  OMNIPAQUE IOHEXOL 350 MG/ML SOLN COMPARISON:  CT head 06/24/2019 FINDINGS: CT HEAD FINDINGS Brain: No evidence of acute infarction, hemorrhage, hydrocephalus, extra-axial collection or mass lesion/mass effect. Vascular: Negative for hyperdense vessel Skull: Negative Sinuses: Negative Orbits: Negative Review of the MIP images confirms the above findings CTA NECK FINDINGS Aortic arch: Bovine branching pattern. Proximal great vessels widely patent. Negative aortic arch. Right carotid system: Normal right carotid. Negative for stenosis or dissection Left carotid system: Normal left carotid. Negative for stenosis or dissection Vertebral arteries: Both vertebral arteries normal and widely patent. Skeleton: Cervical degenerative changes.  No acute skeletal lesion. Other neck: Negative for mass or adenopathy in the neck. Upper chest: Lung apices clear bilaterally. Review of the MIP images confirms the above findings CTA HEAD FINDINGS Anterior circulation: Normal cavernous carotid bilaterally. No significant atherosclerotic disease or stenosis. Anterior and middle cerebral arteries widely patent bilaterally. No aneurysm. Posterior circulation: Both vertebral arteries patent to the  basilar. PICA patent bilaterally. Basilar widely patent. Superior cerebellar and posterior cerebral arteries patent bilaterally. Negative for aneurysm. Venous sinuses: Normal venous enhancement. Anatomic variants: None Review of the MIP images confirms the above findings IMPRESSION: 1. Normal CT head 2. Normal CTA head neck. Electronically Signed   By: Marlan Palau M.D.   On: 11/29/2019 16:29    Procedures Procedures (including critical care time)  Medications Ordered in ED Medications  sodium chloride 0.9 % bolus 1,000 mL ( Intravenous Stopped 11/29/19 1718)  prochlorperazine (COMPAZINE) injection 10 mg (10 mg Intravenous Given 11/29/19 1556)  diphenhydrAMINE (BENADRYL) injection 25 mg (25 mg Intravenous Given 11/29/19 1558)  iohexol (OMNIPAQUE) 350 MG/ML injection 100 mL (100 mLs Intravenous Contrast Given 11/29/19 1604)  ED Course  I have reviewed the triage vital signs and the nursing notes.  Pertinent labs & imaging results that were available during my care of the patient were reviewed by me and considered in my medical decision making (see chart for details).    MDM Rules/Calculators/A&P                      44 yo F with a chief complaints of a headache.  Going on for about 3 weeks.  Today she had the headache occur and then she had a syncopal event.  Sounds like it is worse with bearing down or with certain head movements.  Nontraumatic.  No neurologic deficits on my exam.  Will treat with a headache cocktail.  CT scan of the head.  Patient has had some improvements of her headache with Compazine and fluids.  She is found to be anemic with a hemoglobin of 6.1.  Last check back 5 months ago was 11.  She endorses some heavy vaginal bleeding.  She denies any rectal bleeding or dark stool.  I discussed with her about coming into the hospital.  Initially she agreed.  I discussed it with the hospitalist.  She will be back to the bedside because she really feels that she cannot stay.  I  cautioned her that if her anemia is to a point where it causes her to pass out then this could cause her death or permanent disability especially if she decides to operates a motor vehicle.  The patient would like to follow-up with her hematologist tomorrow.  She plans to call them first thing in the morning.  I offered to have her go to [facility] to get transfused in the ED.  She will be signed out AGAINST MEDICAL ADVICE.  CRITICAL CARE Performed by: Rae Roamaniel Patrick Skyrah Krupp   Total critical care time: 35 minutes  Critical care time was exclusive of separately billable procedures and treating other patients.  Critical care was necessary to treat or prevent imminent or life-threatening deterioration.  Critical care was time spent personally by me on the following activities: development of treatment plan with patient and/or surrogate as well as nursing, discussions with consultants, evaluation of patient's response to treatment, examination of patient, obtaining history from patient or surrogate, ordering and performing treatments and interventions, ordering and review of laboratory studies, ordering and review of radiographic studies, pulse oximetry and re-evaluation of patient's condition.   5:38 PM:  I have discussed the diagnosis/risks/treatment options with the patient and believe the pt to be eligible for discharge home to follow-up with Heme/onc. We also discussed returning to the ED immediately if new or worsening sx occur. We discussed the sx which are most concerning (e.g., sudden worsening pain, fever, repeat syncope) that necessitate immediate return. Medications administered to the patient during their visit and any new prescriptions provided to the patient are listed below.  Medications given during this visit Medications  sodium chloride 0.9 % bolus 1,000 mL ( Intravenous Stopped 11/29/19 1718)  prochlorperazine (COMPAZINE) injection 10 mg (10 mg Intravenous Given 11/29/19 1556)   diphenhydrAMINE (BENADRYL) injection 25 mg (25 mg Intravenous Given 11/29/19 1558)  iohexol (OMNIPAQUE) 350 MG/ML injection 100 mL (100 mLs Intravenous Contrast Given 11/29/19 1604)     The patient appears reasonably screen and/or stabilized for discharge and I doubt any other medical condition or other [facility] requiring further screening, evaluation, or treatment in the ED at this time prior to discharge.  Final Clinical Impression(s) / ED Diagnoses Final diagnoses:  Symptomatic anemia  Frontal headache    Rx / DC Orders ED Discharge Orders    None       Deno Etienne, DO 11/29/19 1738

## 2019-11-29 NOTE — ED Notes (Signed)
States she passed out at work, been having a headache for the past 3 weeks, feels weak, tired, having dizziness and nausea.

## 2019-11-29 NOTE — Discharge Instructions (Signed)
I am concerned because you passed out today.  This is most likely because your blood levels are low.  That usually means that you likely need a transfusion.  Please follow-up with your hematologist and see if they can see you tomorrow in the office.  At anytime you would like to return to the ED for repeat evaluation please return and we will be happy to see you.  You may want to go to a emergency department that is attached to the hospital then they could potentially transfuse you blood while you are in the ED.  Please return for any worsening symptoms or if you pass out again.

## 2019-11-29 NOTE — ED Notes (Signed)
ED Provider at bedside. 

## 2019-11-29 NOTE — ED Triage Notes (Signed)
States Headache for 2 weeks, then states had a syncopal episode 2 hours ago at work while trying to sit In rolling chair, states did not hit head. Drove here, alert, amb.

## 2019-11-29 NOTE — ED Notes (Signed)
[facility] ED NURSING NOTE: son at bedside to take pt home, pt has agreed not to drive, explained to pt again the rationale for admission, still refuses to be admitted, EDP very aware and has spoken with pt. AVS reviewed with client and strongly encouraged to make appt with her MD.

## 2020-06-10 IMAGING — CT CT ABD-PELV W/ CM
3 of 17 series · 11 of 46 positions shown, 15 images · IV contrast (Omnipaque)
Comparison: Prior CTA chest 03/15/2017

CLINICAL DATA: 43-year-old female with influenza, fever, chills,
headache, nausea, vomiting, a past history of pulmonary embolism and
recent exposure to COVID.

EXAM:
CT ANGIOGRAPHY CHEST
CT ABDOMEN AND PELVIS WITH CONTRAST
TECHNIQUE: Multidetector CT imaging of the chest was performed using the
standard protocol during bolus administration of intravenous
contrast. Multiplanar CT image reconstructions and MIPs were
obtained to evaluate the vascular anatomy. Multidetector CT imaging
of the abdomen and pelvis was performed using the standard protocol
during bolus administration of intravenous contrast.
CONTRAST:  100mL OMNIPAQUE IOHEXOL 350 MG/ML SOLN

[Series 8: pe thins · axial · 0.98mm/px · z∈[-284,-61]mm · 8 of 269 slices shown]
[im 23/269  soft-tissue]
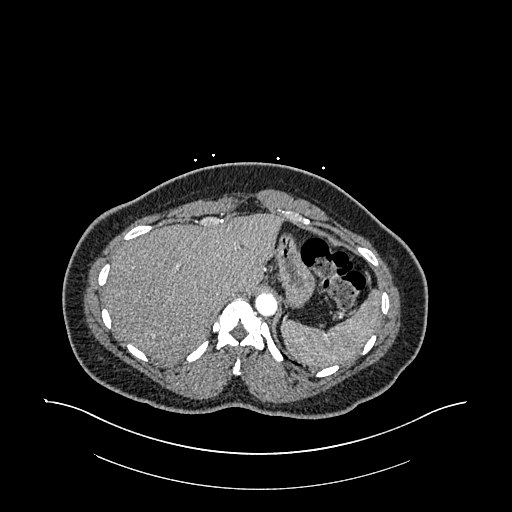
[im 68/269  soft-tissue]
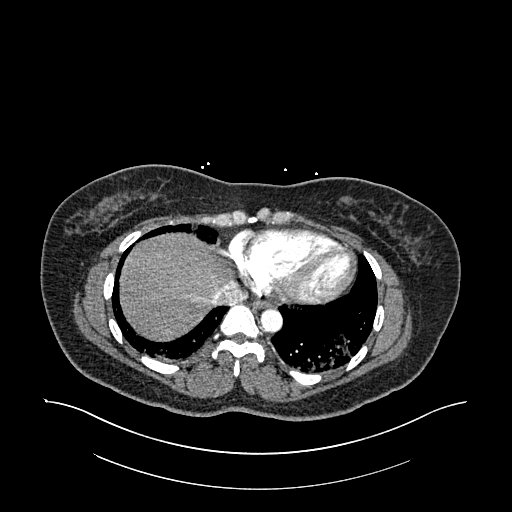
[im 90/269  soft-tissue]
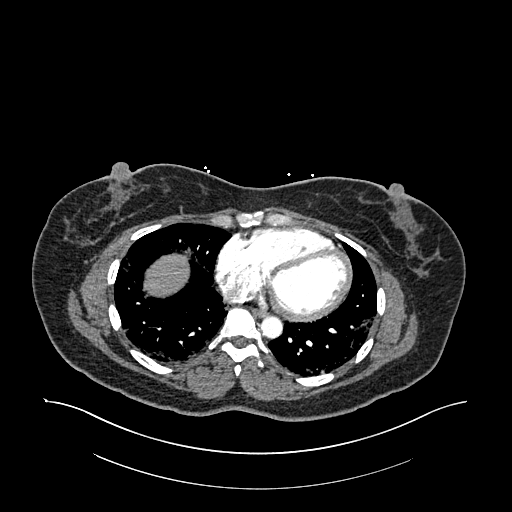
[im 112/269  soft-tissue]
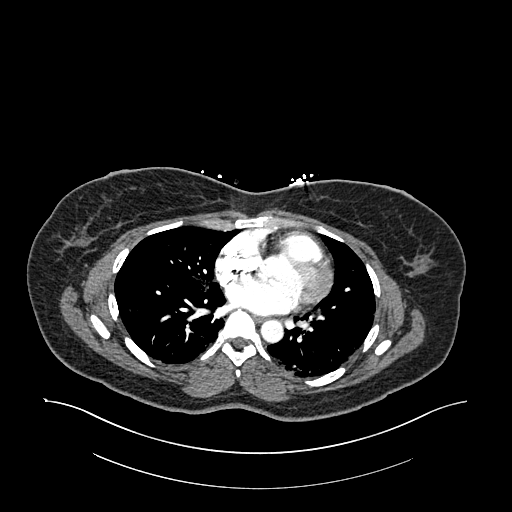
[im 157/269  soft-tissue]
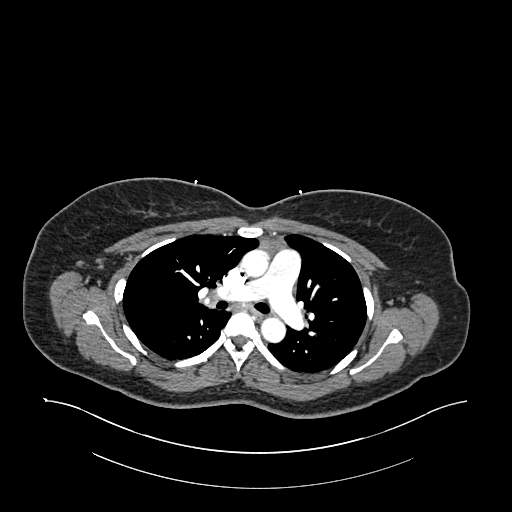
[im 179/269  soft-tissue]
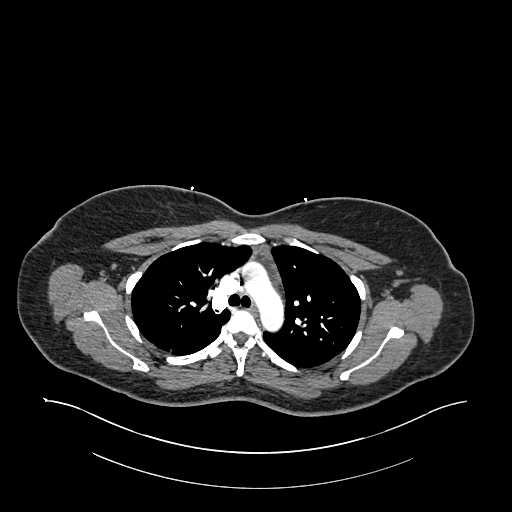
[im 202/269  soft-tissue]
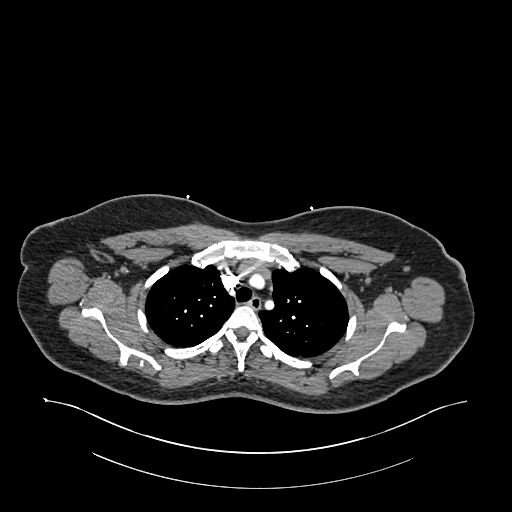
[im 246/269  soft-tissue]
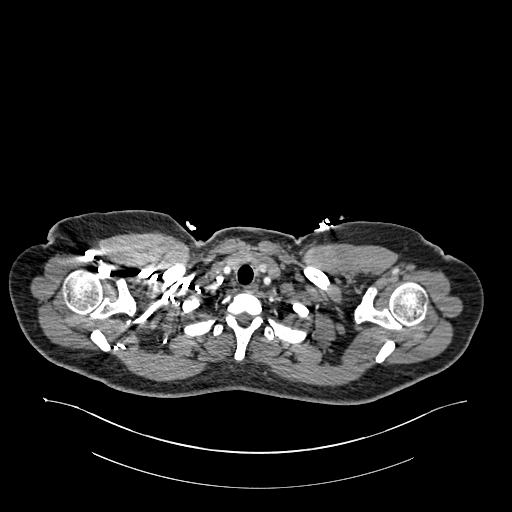

[Series 10: pe coronal mpr · coronal · 0.52mm/px · 1 of 122 slices shown, 2 images]
[im 61/122  soft-tissue]
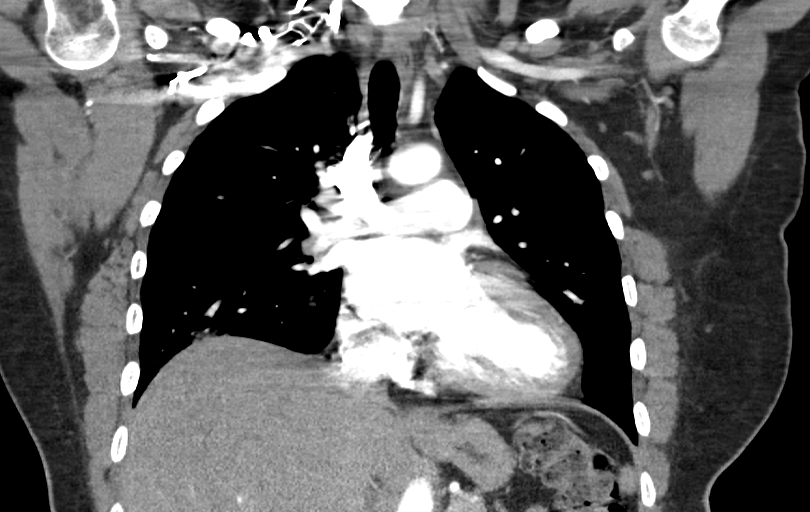
[im 61/122  bone]
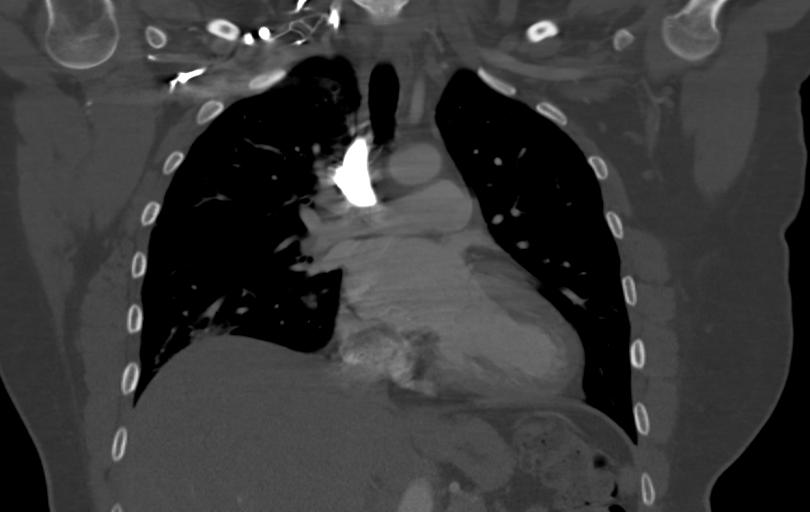

[Series 14: axial st · axial · 0.98mm/px · z∈[-528,-363]mm · 2 of 100 slices shown, 5 images]
[im 34/100  soft-tissue]
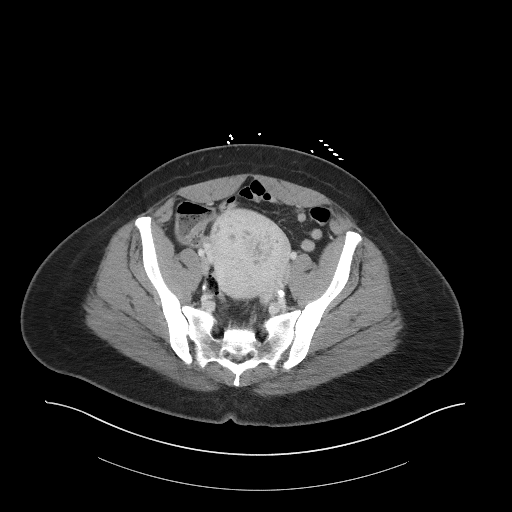
[im 34/100  lung]
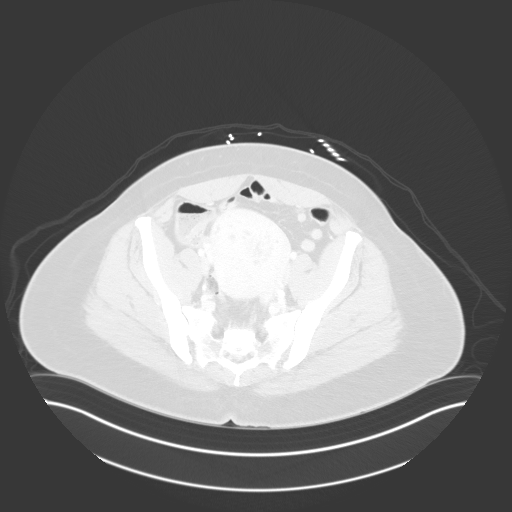
[im 34/100  bone]
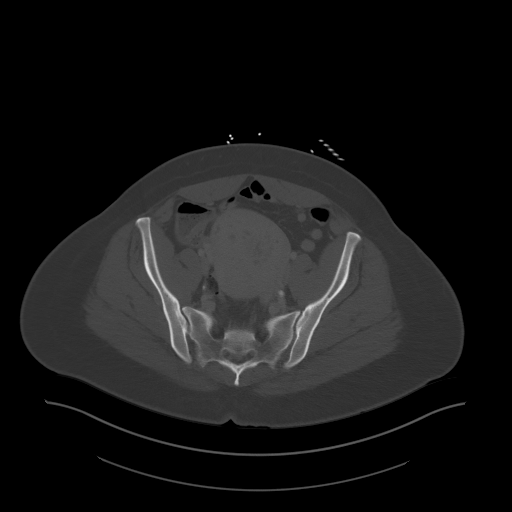
[im 67/100  soft-tissue]
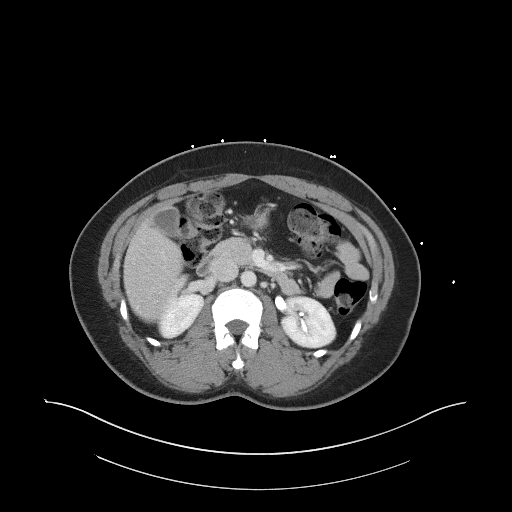
[im 67/100  lung]
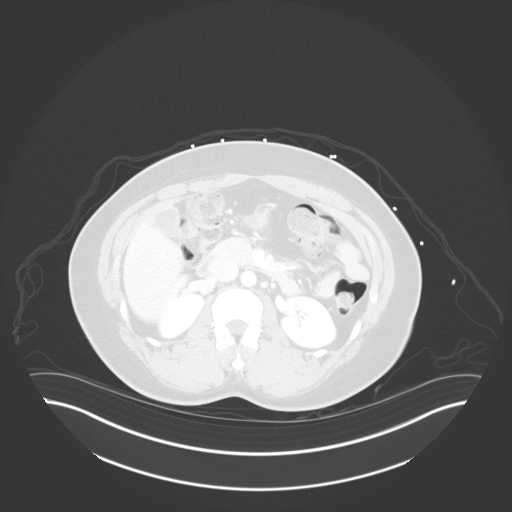

[11 of 46 positions shown; findings below may reference images not displayed]

FINDINGS: CTA CHEST FINDINGS

Cardiovascular: 2 vessel aortic arch. The right brachiocephalic and
left common carotid artery share a common origin. The pulmonary
arteries are well opacified to the proximal segmental level. No
evidence of central filling defect to suggest acute pulmonary
embolus. The heart is normal in size. No pericardial effusion.

Mediastinum/Nodes: Unremarkable CT appearance of the thyroid gland.
No suspicious mediastinal or hilar adenopathy. No soft tissue
mediastinal mass. The thoracic esophagus is unremarkable.

Lungs/Pleura: Scattered bilateral predominantly peripheral
interstitial and airspace opacities involving all lobes of both
lungs with a lower lobe predominant pattern. No evidence of
pulmonary edema, pleural effusion or pneumothorax.

Musculoskeletal: No acute fracture or aggressive appearing lytic or
blastic osseous lesion.

Review of the MIP images confirms the above findings.

CT ABDOMEN and PELVIS FINDINGS

Hepatobiliary: No focal liver abnormality is seen. No gallstones,
gallbladder wall thickening, or biliary dilatation.

Pancreas: Unremarkable. No pancreatic ductal dilatation or
surrounding inflammatory changes.

Spleen: Normal in size without focal abnormality.

Adrenals/Urinary Tract: Adrenal glands are unremarkable. Kidneys are
normal, without renal calculi, focal lesion, or hydronephrosis.
Bladder is unremarkable.

Stomach/Bowel: Stomach is within normal limits. Appendix appears
normal. No evidence of bowel wall thickening, distention, or
inflammatory changes.

Vascular/Lymphatic: No significant vascular findings are present. No
enlarged abdominal or pelvic lymph nodes.

Reproductive: The uterus is diffusely enlarged. There are several
areas of circumscribed hypoattenuation consistent with uterine
leiomyomas. However, the endometrial cavity is also diffusely
irregular with innumerable small subcentimeter low-attenuation foci
speckled throughout the uterine myometrium adjacent to the
endometrial canal. Additionally, there is a 2 cm solid enhancing
mass within the endometrial canal.

Other: No abdominal wall hernia or abnormality. No abdominopelvic
ascites.

Musculoskeletal: No acute fracture or aggressive appearing lytic or
blastic osseous lesion.

Review of the MIP images confirms the above findings.
IMPRESSION: CTA CHEST

1. Bilateral peripheral and basilar ground-glass attenuation
airspace opacities with associated bilateral lower lobe atelectasis.
Findings are consistent with viral pneumonia including 91KSE-0H,
particularly given patient's recent exposure.
2. No evidence of acute pulmonary embolus.

CT ABD/PELVIS

1. No acute abnormality within the abdomen or pelvis.
2. Abnormally enlarged uterus with multiple definitive uterine
fibroids and additional findings of innumerable subcentimeter
low-attenuation lesions scattered throughout the myometrium around
the periphery of the endometrial canal. Differential considerations
include adenomyosis, diffuse uterine leiomyomatosis, and potentially
endometrial carcinoma. Recommend non emergent referral to OBGYN for
further evaluation.
3. Approximately 2 cm enhancing mass within the endometrial cavity
may represent a pedunculated submucosal uterine fibroid, endometrial
polyp, or endometrial neoplasm. A pedunculated uterine fibroid is
favored.

## 2020-07-22 ENCOUNTER — Other Ambulatory Visit: Payer: BC Managed Care – PPO

## 2023-01-18 ENCOUNTER — Other Ambulatory Visit: Payer: Self-pay

## 2023-01-18 ENCOUNTER — Emergency Department (HOSPITAL_BASED_OUTPATIENT_CLINIC_OR_DEPARTMENT_OTHER): Payer: BC Managed Care – PPO

## 2023-01-18 ENCOUNTER — Encounter (HOSPITAL_BASED_OUTPATIENT_CLINIC_OR_DEPARTMENT_OTHER): Payer: Self-pay | Admitting: Emergency Medicine

## 2023-01-18 DIAGNOSIS — Z7901 Long term (current) use of anticoagulants: Secondary | ICD-10-CM | POA: Insufficient documentation

## 2023-01-18 DIAGNOSIS — R079 Chest pain, unspecified: Secondary | ICD-10-CM | POA: Diagnosis not present

## 2023-01-18 DIAGNOSIS — M79601 Pain in right arm: Secondary | ICD-10-CM | POA: Insufficient documentation

## 2023-01-18 DIAGNOSIS — M79605 Pain in left leg: Secondary | ICD-10-CM | POA: Diagnosis not present

## 2023-01-18 DIAGNOSIS — R531 Weakness: Secondary | ICD-10-CM | POA: Diagnosis not present

## 2023-01-18 NOTE — ED Triage Notes (Addendum)
Patient from home with intense pain in R arm and L leg. Patient is a poor historian, however called aunt at 10:14 pm and reported the pain to her aunt. Patient does have a history of PE in the past on xarelto currently. Reports chest pain as well, denies shortness of breath. Accompanied by aunt

## 2023-01-19 ENCOUNTER — Emergency Department (HOSPITAL_BASED_OUTPATIENT_CLINIC_OR_DEPARTMENT_OTHER): Payer: BC Managed Care – PPO

## 2023-01-19 ENCOUNTER — Emergency Department (HOSPITAL_BASED_OUTPATIENT_CLINIC_OR_DEPARTMENT_OTHER)
Admission: EM | Admit: 2023-01-19 | Discharge: 2023-01-19 | Disposition: A | Discharge status: Home / Self Care | Payer: BC Managed Care – PPO | Attending: Emergency Medicine | Admitting: Emergency Medicine

## 2023-01-19 DIAGNOSIS — M791 Myalgia, unspecified site: Secondary | ICD-10-CM

## 2023-01-19 LAB — BASIC METABOLIC PANEL
Anion gap: 8 (ref 5–15)
BUN: 12 mg/dL (ref 6–20)
CO2: 21 mmol/L — ABNORMAL LOW (ref 22–32)
Calcium: 8.6 mg/dL — ABNORMAL LOW (ref 8.9–10.3)
Chloride: 106 mmol/L (ref 98–111)
Creatinine, Ser: 0.95 mg/dL (ref 0.44–1.00)
GFR, Estimated: >60 mL/min (ref >60)
Glucose, Bld: 105 mg/dL — ABNORMAL HIGH (ref 70–99)
Potassium: 3.6 mmol/L (ref 3.5–5.1)
Sodium: 135 mmol/L (ref 135–145)

## 2023-01-19 LAB — CBC
HCT: 34.1 % — ABNORMAL LOW (ref 36.0–46.0)
Hemoglobin: 11.3 g/dL — ABNORMAL LOW (ref 12.0–15.0)
MCH: 30 pg (ref 26.0–34.0)
MCHC: 33.1 g/dL (ref 30.0–36.0)
MCV: 90.5 fL (ref 80.0–100.0)
Platelets: 279 10*3/uL (ref 150–400)
RBC: 3.77 MIL/uL — ABNORMAL LOW (ref 3.87–5.11)
RDW: 14.7 % (ref 11.5–15.5)
WBC: 4.6 10*3/uL (ref 4.0–10.5)
nRBC: 0 % (ref 0.0–0.2)

## 2023-01-19 LAB — CK: Total CK: 216 U/L (ref 38–234)

## 2023-01-19 LAB — TROPONIN I (HIGH SENSITIVITY)
Troponin I (High Sensitivity): 2 ng/L (ref <18)
Troponin I (High Sensitivity): 2 ng/L (ref <18)

## 2023-01-19 MED ORDER — IOHEXOL 350 MG/ML SOLN
75.0000 mL | Freq: Once | INTRAVENOUS | Status: AC | PRN
  Administered 2023-01-19: 75 mL via INTRAVENOUS

## 2023-01-19 NOTE — ED Notes (Signed)
Reviewed AVS with patient, patient expressed understanding of directions, denies further questions at this time. 

## 2023-01-19 NOTE — ED Provider Notes (Signed)
[place] [facility] Provider Note   CSN: [id number] Arrival date & time: 01/18/23  2320     History  Chief Complaint  Patient presents with   Chest Pain   Arm Pain   Leg Pain    Helen Vincent is a 47 y.o. female.  Patient with history of previous stroke with right-sided deficits, PE, DVT on Eliquis presenting with right arm pain and left leg pain.  Symptoms came on atraumatically around 10 PM.  Symptoms lasted several hours but are now resolved after eating mustard.  She describes crampy pain involving her entire right arm and her entire left leg.  Not resolved.  Does have right-sided weakness from previous stroke which is unchanged.  Contrary to triage note she denies chest pain.  Denies shortness of breath.  Denies cough or fever.  At mental baseline per aunt in the room. Several missed doses of Eliquis recently but cannot say exactly when she last took it.  Otherwise has been compliant with her medications.  She is having no arm pain or leg pain now.  No neck or back pain.   The history is provided by the patient.  Chest Pain Associated symptoms: no abdominal pain, no cough, no dizziness, no fever, no headache, no nausea, no shortness of breath, no vomiting and no weakness   Arm Pain Associated symptoms include chest pain. Pertinent negatives include no abdominal pain, no headaches and no shortness of breath.  Leg Pain Associated symptoms: no fever        Home Medications Prior to Admission medications   Medication Sig Start Date End Date Taking? Authorizing Provider  ALPRAZolam Prudy Feeler) 0.5 MG tablet Take 1 tablet (0.5 mg total) by mouth at bedtime as needed for sleep. 06/24/19   Terrilee Files, MD  lactulose, encephalopathy, (CHRONULAC) 10 GM/15ML SOLN Take 15 mLs (10 g total) by mouth 2 (two) times daily as needed (constipation). 06/24/19   Terrilee Files, MD  rivaroxaban (XARELTO) 20 MG TABS tablet Take 20 mg by mouth daily  with supper.    [provider]      Allergies    Tylenol with codeine #3 [acetaminophen-codeine], Codeine, and Shellfish allergy    Review of Systems   Review of Systems  Constitutional:  Negative for activity change, appetite change and fever.  HENT:  Negative for congestion and rhinorrhea.   Respiratory:  Negative for cough, chest tightness and shortness of breath.   Cardiovascular:  Positive for chest pain.  Gastrointestinal:  Negative for abdominal pain, nausea and vomiting.  Genitourinary:  Negative for dysuria and hematuria.  Musculoskeletal:  Positive for arthralgias and myalgias.  Skin:  Negative for rash.  Neurological:  Negative for dizziness, weakness and headaches.    all other systems are negative except as noted in the HPI and PMH.   Physical Exam Updated Vital Signs BP 113/71   Pulse 65   Temp 98.8 F (37.1 C) (Oral)   Resp 16   Ht 5\' 7"  (1.702 m)   Wt 93 kg   SpO2 98%   BMI 32.11 kg/m  Physical Exam Vitals and nursing note reviewed.  Constitutional:      General: She is not in acute distress.    Appearance: She is well-developed.  HENT:     Head: Normocephalic and atraumatic.     Mouth/Throat:     Pharynx: No oropharyngeal exudate.  Eyes:     Conjunctiva/sclera: Conjunctivae normal.  Pupils: Pupils are equal, round, and reactive to light.  Neck:     Comments: No meningismus. Cardiovascular:     Rate and Rhythm: Normal rate and regular rhythm.     Heart sounds: Normal heart sounds. No murmur heard. Pulmonary:     Effort: Pulmonary effort is normal. No respiratory distress.     Breath sounds: Normal breath sounds.  Abdominal:     Palpations: Abdomen is soft.     Tenderness: There is no abdominal tenderness. There is no guarding or rebound.  Musculoskeletal:        General: No tenderness. Normal range of motion.     Cervical back: Normal range of motion and neck supple.     Comments: Compartments are soft and nontender involving left  leg and right arm.  Intact radial and DP and PT pulses.  Full range of motion of ankle, hip and knee. Full motion of right shoulder, elbow and wrist.  Skin:    General: Skin is warm.  Neurological:     Mental Status: She is alert.     Cranial Nerves: Cranial nerve deficit present.     Motor: Weakness present. No abnormal muscle tone.     Coordination: Coordination abnormal.     Comments: Right-sided weakness at baseline. 5/5 strength on L.  Psychiatric:        Behavior: Behavior normal.     ED Results / Procedures / Treatments   Labs (all labs ordered are listed, but only abnormal results are displayed) Labs Reviewed  BASIC METABOLIC PANEL - Abnormal; Notable for the following components:      Result Value   CO2 21 (*)    Glucose, Bld 105 (*)    Calcium 8.6 (*)    All other components within normal limits  CBC - Abnormal; Notable for the following components:   RBC 3.77 (*)    Hemoglobin 11.3 (*)    HCT 34.1 (*)    All other components within normal limits  CK  PREGNANCY, URINE  TROPONIN I (HIGH SENSITIVITY)  TROPONIN I (HIGH SENSITIVITY)    EKG None  Radiology CT Angio Chest PE W and/or Wo Contrast  Result Date: 01/19/2023 CLINICAL DATA:  Pulmonary embolism (PE) suspected, high prob History of PE, chest pain, noncompliant with Eliquis chest pain, right arm pain EXAM: CT ANGIOGRAPHY CHEST WITH CONTRAST TECHNIQUE: Multidetector CT imaging of the chest was performed using the standard protocol during bolus administration of intravenous contrast. Multiplanar CT image reconstructions and MIPs were obtained to evaluate the vascular anatomy. RADIATION DOSE REDUCTION: This exam was performed according to the departmental dose-optimization program which includes automated exposure control, adjustment of the mA and/or kV according to patient size and/or use of iterative reconstruction technique. CONTRAST:  75mL OMNIPAQUE IOHEXOL 350 MG/ML SOLN COMPARISON:  05/27/2022 FINDINGS:  Cardiovascular: No filling defects in the pulmonary arteries to suggest pulmonary emboli. Heart is normal size. Aorta is normal caliber. Mediastinum/Nodes: No mediastinal, hilar, or axillary adenopathy. Trachea and esophagus are unremarkable. Thyroid unremarkable. Lungs/Pleura: Lungs are clear. No focal airspace opacities or suspicious nodules. No effusions. Upper Abdomen: No acute findings Musculoskeletal: Chest wall soft tissues are unremarkable. No acute bony abnormality. Review of the MIP images confirms the above findings. IMPRESSION: No evidence of pulmonary embolus. No acute cardiopulmonary disease. Electronically Signed   By: Charlett Nose M.D.   On: 01/19/2023 02:50   DG Chest 2 View  Result Date: 01/19/2023 CLINICAL DATA:  Chest pain EXAM: CHEST - 2 VIEW COMPARISON:  05/21/2022 FINDINGS: The heart size and mediastinal contours are within normal limits. Both lungs are clear. The visualized skeletal structures are unremarkable. IMPRESSION: No active cardiopulmonary disease. Electronically Signed   By: Charlett Nose M.D.   On: 01/19/2023 00:10    Procedures Procedures    Medications Ordered in ED Medications - No data to display  ED Course/ Medical Decision Making/ A&P                             Medical Decision Making Amount and/or Complexity of Data Reviewed Labs: ordered. Decision-making details documented in ED Course. Radiology: ordered and independent interpretation performed. Decision-making details documented in ED Course. ECG/medicine tests: ordered and independent interpretation performed. Decision-making details documented in ED Course.  Risk Prescription drug management.   Arm pain and leg pain now resolved.  Concern for possible electrolyte abnormality.  Has right-sided weakness with previous stroke which is unchanged.   Muscle aches have resolved.  Electrolytes are checked to ensure no electrolyte abnormalities.  Potassium is normal, CK is normal.  Her right-sided  weakness is at baseline.  She denies any chest pain or shortness of breath.  Troponin negative x 2.  CT was obtained given her report of missed doses of Eliquis.  No evidence of pulmonary embolism.  No hypoxia or tachycardia or increased work of breathing.  EKG without acute ischemia.  Suspect myalgias likely secondary to dehydration.  Now resolved.  She is tolerating p.o.  Advised to increase p.o. hydration at home and follow-up with PCP.  Return precautions discussed.        Final Clinical Impression(s) / ED Diagnoses Final diagnoses:  None    Rx / DC Orders ED Discharge Orders     None         Dewanna Hurston, Jeannett Senior, MD 01/19/23 [phone]

## 2023-01-19 NOTE — Discharge Instructions (Signed)
No evidence of heart attack or blood clot in the lung.  Keep yourself hydrated.  Follow-up with your doctor for recheck of her electrolytes this week.  Return to the ED with new or worsening symptoms.
